# Patient Record
Sex: Female | Born: 1955 | Race: White | Hispanic: No | Marital: Married | Smoking: Never smoker
Health system: Southern US, Community
[De-identification: ages and names within clinical notes are randomized; demographics above are authoritative.]

## PROBLEM LIST (undated history)

## (undated) DIAGNOSIS — E7800 Pure hypercholesterolemia, unspecified: Secondary | ICD-10-CM

## (undated) DIAGNOSIS — I10 Essential (primary) hypertension: Secondary | ICD-10-CM

## (undated) DIAGNOSIS — Z889 Allergy status to unspecified drugs, medicaments and biological substances status: Secondary | ICD-10-CM

## (undated) DIAGNOSIS — G20 Parkinson's disease: Secondary | ICD-10-CM

## (undated) DIAGNOSIS — N809 Endometriosis, unspecified: Secondary | ICD-10-CM

## (undated) DIAGNOSIS — G20A1 Parkinson's disease without dyskinesia, without mention of fluctuations: Secondary | ICD-10-CM

## (undated) DIAGNOSIS — M069 Rheumatoid arthritis, unspecified: Secondary | ICD-10-CM

## (undated) HISTORY — DX: Essential (primary) hypertension: I10

## (undated) HISTORY — DX: Allergy status to unspecified drugs, medicaments and biological substances: Z88.9

## (undated) HISTORY — DX: Parkinson's disease: G20

## (undated) HISTORY — DX: Rheumatoid arthritis, unspecified: M06.9

## (undated) HISTORY — DX: Pure hypercholesterolemia, unspecified: E78.00

## (undated) HISTORY — DX: Parkinson's disease without dyskinesia, without mention of fluctuations: G20.A1

## (undated) HISTORY — DX: Endometriosis, unspecified: N80.9

## (undated) HISTORY — PX: OTHER SURGICAL HISTORY: SHX169

---
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
## 1979-02-15 HISTORY — PX: LAPAROSCOPY: SHX197

## 1989-02-14 HISTORY — PX: FINGER SURGERY: SHX640

## 1991-02-15 HISTORY — PX: OTHER SURGICAL HISTORY: SHX169

## 2002-02-05 ENCOUNTER — Ambulatory Visit (HOSPITAL_BASED_OUTPATIENT_CLINIC_OR_DEPARTMENT_OTHER): Admission: RE | Admit: 2002-02-05 | Discharge: 2002-02-06 | Payer: Self-pay | Admitting: Orthopaedic Surgery

## 2002-02-14 HISTORY — PX: ARTHROSCOPIC REPAIR ACL: SUR80

## 2003-12-30 ENCOUNTER — Encounter: Admission: RE | Admit: 2003-12-30 | Discharge: 2003-12-30 | Payer: Self-pay | Admitting: Family Medicine

## 2004-02-11 ENCOUNTER — Ambulatory Visit (HOSPITAL_COMMUNITY): Admission: RE | Admit: 2004-02-11 | Discharge: 2004-02-11 | Payer: Self-pay | Admitting: *Deleted

## 2006-06-23 ENCOUNTER — Encounter: Admission: RE | Admit: 2006-06-23 | Discharge: 2006-06-23 | Payer: Self-pay | Admitting: Obstetrics and Gynecology

## 2006-08-22 ENCOUNTER — Encounter: Admission: RE | Admit: 2006-08-22 | Discharge: 2006-08-22 | Payer: Self-pay | Admitting: Obstetrics and Gynecology

## 2007-08-15 ENCOUNTER — Ambulatory Visit (HOSPITAL_BASED_OUTPATIENT_CLINIC_OR_DEPARTMENT_OTHER): Admission: RE | Admit: 2007-08-15 | Discharge: 2007-08-15 | Payer: Self-pay | Admitting: Obstetrics and Gynecology

## 2010-03-07 ENCOUNTER — Encounter: Payer: Self-pay | Admitting: Obstetrics and Gynecology

## 2010-07-02 NOTE — Op Note (Signed)
NAMEJOCILYNN, GRADE                            ACCOUNT NO.:  [id number]   MEDICAL RECORD NO.:  [id number]                   PATIENT TYPE:  AMB   LOCATION:  DSC                                  FACILITY:  [facility]   PHYSICIAN:  Lubertha Basque. Jerl Santos, M.D.             DATE OF BIRTH:  January 13, 1956   DATE OF PROCEDURE:  02/05/2002  DATE OF DISCHARGE:                                 OPERATIVE REPORT   PREOPERATIVE DIAGNOSIS:  1. Left knee anterior cruciate ligament tear.  2. Left knee torn medial meniscus.   POSTOPERATIVE DIAGNOSIS:  1. Left knee anterior cruciate ligament tear.  2. Left knee torn medial meniscus.  3. Left knee chondromalacia of the patella.   PROCEDURE:  1. Left knee anterior cruciate ligament reconstruction.  2. Left knee partial medial meniscectomy.  3. Left knee chondroplasty of the patella.   ANESTHESIA:  General and femoral nerve block.   SURGEON:  Lubertha Basque. Jerl Santos, M.D.   INDICATIONS FOR PROCEDURE:  The patient is a 55 year old woman who sustained  a work related injury several weeks ago.  She has been left with a locked  knee which has not come to extension.  She has undergone MRI scan which  showed a displaced medial meniscus tear along with an ACL tear which was  complete.  She was offered operative intervention first just to unlock her  knee and then to reconstruct the ACL at the same setting.  The procedures  were discussed with the patient and informed operative consent was obtained  after discussion of the possible complications of reaction to anesthesia and  infection.  She also understood about the extensive postoperative therapy  required after this type of operation.   DESCRIPTION OF PROCEDURE:  The patient was taken to the operating room where  general anesthetic was applied without difficulty.  She was positioned  supine and prepped and draped in normal sterile fashion.  After the  administration of antibiotics, an arthroscopy of the left knee was  performed  through two inferior portals.  The suprapatellar pouch was benign while the  patellofemoral joint exhibited some grade 3 chondromalacia on the under  surface of the patella and this was addressed with a chondroplasty.  The  medial compartment showed a displaced bucket handle tear of the medial  meniscus in the avascular portion.  This was stuck in an anterior position.  This was removed taking about 20% of the medial meniscus in the process.  There were no degenerative changes in the medial compartment.  The lateral  compartment showed no evidence of meniscal or articular cartilage injury.  The ACL was completely torn and was removed from the notch.  The PCL was  intact.  A thorough notchplasty was done with an arthroscopic bur.  An  allograft was defrosted on the back table completely and was then contoured  to fit through 9 and 10 mm tunnels.  Drill holes were placed in each of the  bone blocks with a PDS suture placed in one and a wire placed in the other.  A guide was then placed in the knee on the tibia just anterior to the PCL.  A stab wound was made on the anterior aspect of the knee and a guide-wire  was placed through this using the guide into the knee.  This was over-reamed  to a diameter of 10 mm with an arthroscopic reamer.  A second guide was  placed in the over the top position just anterior to this area.  A guide-  wire was placed through the tibial tunnel utilizing the guide and entering  the femur in the appropriate position.  This was utilized to over ream the  distal femur to a depth of 3 cm and diameter of 9 mm leaving a 1-2 mm  posterior wall which was well visualized.  Some bony debris was removed from  the knee with the shaver.  The aforementioned graft was then pulled into  position.  Care was taken to keep the tendinous surface of the graft facing  posteriorly.  This graft was secured in the femoral tunnel with an 8 by 25  metal screw placed over an  anterior guide-wire in the tunnel.  The knee was  then ranged fully and the graft was felt to be very isometric.  There was no  impingement at full extension.  A second guide-wire was placed through the  tibial tunnel and was utilized to place an identical screw to secure the  tibial bone plug in the tibial tunnel.  The knee again ranged fully.  The  knee was irrigated followed by placement of Marcaine with epinephrine.  Nylon was used to loosely reapproximate the anterior stab wound.  Adaptic  was applied to the wound followed by dry gauze and a loose Ace wrap.  Estimated blood loss and fluids can be obtained from anesthesia records.  No  tourniquet was placed.   DISPOSITION:  The patient was given a femoral nerve block at the end of the  case.  She was then taken to the recovery room in stable condition with  plans to stay overnight for pain control.                                               Lubertha Basque Jerl Santos, M.D.    PGD/MEDQ  D:  02/05/2002  T:  02/05/2002  Job:  [id number]

## 2010-07-09 ENCOUNTER — Other Ambulatory Visit (HOSPITAL_BASED_OUTPATIENT_CLINIC_OR_DEPARTMENT_OTHER): Payer: Self-pay | Admitting: Internal Medicine

## 2010-07-09 DIAGNOSIS — Z1231 Encounter for screening mammogram for malignant neoplasm of breast: Secondary | ICD-10-CM

## 2010-07-13 ENCOUNTER — Ambulatory Visit (HOSPITAL_BASED_OUTPATIENT_CLINIC_OR_DEPARTMENT_OTHER)
Admission: RE | Admit: 2010-07-13 | Discharge: 2010-07-13 | Disposition: A | Discharge status: Home / Self Care | Payer: BC Managed Care – PPO | Source: Ambulatory Visit | Attending: Internal Medicine | Admitting: Internal Medicine

## 2010-07-13 DIAGNOSIS — Z1231 Encounter for screening mammogram for malignant neoplasm of breast: Secondary | ICD-10-CM | POA: Insufficient documentation

## 2011-08-01 ENCOUNTER — Other Ambulatory Visit: Payer: Self-pay | Admitting: Obstetrics and Gynecology

## 2011-08-01 DIAGNOSIS — Z1231 Encounter for screening mammogram for malignant neoplasm of breast: Secondary | ICD-10-CM

## 2011-08-08 ENCOUNTER — Ambulatory Visit (HOSPITAL_BASED_OUTPATIENT_CLINIC_OR_DEPARTMENT_OTHER)
Admission: RE | Admit: 2011-08-08 | Discharge: 2011-08-08 | Disposition: A | Discharge status: Home / Self Care | Payer: BC Managed Care – PPO | Source: Ambulatory Visit | Attending: Obstetrics and Gynecology | Admitting: Obstetrics and Gynecology

## 2011-08-08 DIAGNOSIS — Z1231 Encounter for screening mammogram for malignant neoplasm of breast: Secondary | ICD-10-CM | POA: Insufficient documentation

## 2011-08-23 ENCOUNTER — Other Ambulatory Visit: Payer: Self-pay | Admitting: Obstetrics and Gynecology

## 2011-08-23 ENCOUNTER — Other Ambulatory Visit (HOSPITAL_COMMUNITY)
Admission: RE | Admit: 2011-08-23 | Discharge: 2011-08-23 | Disposition: A | Discharge status: Home / Self Care | Payer: BC Managed Care – PPO | Source: Ambulatory Visit | Attending: Obstetrics and Gynecology | Admitting: Obstetrics and Gynecology

## 2011-08-23 DIAGNOSIS — Z124 Encounter for screening for malignant neoplasm of cervix: Secondary | ICD-10-CM | POA: Insufficient documentation

## 2012-10-05 ENCOUNTER — Other Ambulatory Visit: Payer: Self-pay | Admitting: Obstetrics and Gynecology

## 2012-10-05 DIAGNOSIS — Z1231 Encounter for screening mammogram for malignant neoplasm of breast: Secondary | ICD-10-CM

## 2012-10-08 ENCOUNTER — Ambulatory Visit (HOSPITAL_BASED_OUTPATIENT_CLINIC_OR_DEPARTMENT_OTHER)
Admission: RE | Admit: 2012-10-08 | Discharge: 2012-10-08 | Disposition: A | Discharge status: Home / Self Care | Payer: BC Managed Care – PPO | Source: Ambulatory Visit | Attending: Obstetrics and Gynecology | Admitting: Obstetrics and Gynecology

## 2012-10-08 ENCOUNTER — Inpatient Hospital Stay (HOSPITAL_BASED_OUTPATIENT_CLINIC_OR_DEPARTMENT_OTHER): Admission: RE | Admit: 2012-10-08 | Payer: BC Managed Care – PPO | Source: Ambulatory Visit

## 2012-10-08 DIAGNOSIS — Z1231 Encounter for screening mammogram for malignant neoplasm of breast: Secondary | ICD-10-CM | POA: Insufficient documentation

## 2013-06-14 ENCOUNTER — Encounter: Payer: Self-pay | Admitting: Neurology

## 2013-06-17 ENCOUNTER — Encounter (INDEPENDENT_AMBULATORY_CARE_PROVIDER_SITE_OTHER): Payer: Self-pay

## 2013-06-17 ENCOUNTER — Encounter: Payer: Self-pay | Admitting: Neurology

## 2013-06-17 ENCOUNTER — Ambulatory Visit (INDEPENDENT_AMBULATORY_CARE_PROVIDER_SITE_OTHER): Payer: BC Managed Care – PPO | Admitting: Neurology

## 2013-06-17 VITALS — BP 135/58 | HR 67 | Ht 68.0 in | Wt 228.0 lb

## 2013-06-17 DIAGNOSIS — R259 Unspecified abnormal involuntary movements: Secondary | ICD-10-CM

## 2013-06-17 DIAGNOSIS — R251 Tremor, unspecified: Secondary | ICD-10-CM

## 2013-06-17 DIAGNOSIS — R269 Unspecified abnormalities of gait and mobility: Secondary | ICD-10-CM

## 2013-06-17 DIAGNOSIS — R32 Unspecified urinary incontinence: Secondary | ICD-10-CM

## 2013-06-17 DIAGNOSIS — R2681 Unsteadiness on feet: Secondary | ICD-10-CM

## 2013-06-17 NOTE — Progress Notes (Signed)
[facility]    Provider:  Dr Hosie Poisson Referring Provider: Jackie Plum, MD Primary Care Physician:  Jackie Plum, MD  CC:  tremors  HPI:  Laura Lawrence is a 58 y.o. female here as a referral from Dr. Julio Sicks for tremors  Feels symptoms started around 2 months ago and has gotten progressively worse since then. Describes both a rest and action/postural tremor. Handwriting has gotten messier, no change in size. Notes some generalized slowing down of her movements. She feels off balance, having falls recently. Having difficulty with fine motor tasks. Notes some muscle cramps, aching in her legs. Tremor worse with stress/anxiety. No change with caffeine. Does not drink EtOH. No recent changes to her medications. Around Thanksgiving has noted loss of bladder control, states she will go without even knowing she was going.   No family history of tremor. Has history of HTN, no known TIA or strokes in the past.   Review of Systems: Out of a complete 14 system review, the patient complains of only the following symptoms, and all other reviewed systems are negative. + tremor, gait instability, fatigue  History   Social History  . Marital Status: Married    Spouse Name: Casimiro Needle    Number of Children: N/A  . Years of Education: N/A   Occupational History  . Not on file.   Social History Main Topics  . Smoking status: Never Smoker   . Smokeless tobacco: Never Used  . Alcohol Use: No  . Drug Use: No  . Sexual Activity: Not on file   Other Topics Concern  . Not on file   Social History Narrative   Patient is married Casimiro Needle).    Family History  Problem Relation Age of Onset  . Heart disease Father   . Osteoporosis Father   . Hypertension Father   . Leukemia Father   . Diabetes Mother   . Arthritis Mother   . Asthma Mother   . Hypertension Mother   . Bursitis Mother   . Gout      Past Medical History  Diagnosis Date  . Rheumatoid arthritis    . Hypercholesterolemia   . Hypertension   . Multiple allergies     No past surgical history on file.  Current Outpatient Prescriptions  Medication Sig Dispense Refill  . Adalimumab (HUMIRA) 40 MG/0.8ML PSKT Inject into the skin. Every two weeks.      . ACETAMINOPHEN-BUTALBITAL 50-650 MG TABS Take 1 tablet by mouth. Every 8-12 hours as needed.      . fluticasone (FLONASE) 50 MCG/ACT nasal spray Place 1 spray into both nostrils daily.      Marland Kitchen lisinopril (PRINIVIL,ZESTRIL) 30 MG tablet Take 30 mg by mouth daily.      . methotrexate (50 MG/ML) 1 G injection Inject into the vein once. 0.6 Injection- Once a week.      . NORTRIPTYLINE HCL PO Take 1 capsule by mouth at bedtime.      . pravastatin (PRAVACHOL) 20 MG tablet Take 20 mg by mouth at bedtime.       No current facility-administered medications for this visit.    Allergies as of 06/17/2013 - Review Complete 06/17/2013  Allergen Reaction Noted  . Codeine  06/14/2013  . Guaifenesin & derivatives  06/14/2013  . Penicillins  06/14/2013  . Sulfa antibiotics  06/14/2013    Vitals: BP 135/58  Pulse 67  Ht 5\' 8"  (1.727 m)  Wt 228 lb (103.42 kg)  BMI 34.68 kg/m2 Last Weight:  Wt Readings from Last 1 Encounters:  06/17/13 228 lb (103.42 kg)   Last Height:   Ht Readings from Last 1 Encounters:  06/17/13 5\' 8"  (1.727 m)     Physical exam: Exam: Gen: NAD, conversant Eyes: anicteric sclerae, moist conjunctivae HENT: Atraumatic, oropharynx clear Neck: Trachea midline; supple,  Lungs: CTA, no wheezing, rales, rhonic                          CV: RRR, no MRG Abdomen: Soft, non-tender;  Extremities: No peripheral edema  Skin: Normal temperature, no rash,  Psych: Appropriate affect, pleasant  Neuro: MS: AA&Ox3, appropriately interactive, normal affect   Attention: WORLD backwards  Speech: fluent w/o paraphasic error  Memory: good recent and remote recall  CN: PERRL, EOMI no nystagmus, no ptosis, sensation intact to LT  V1-V3 bilat, face symmetric, no weakness, hearing grossly intact, palate elevates symmetrically, shoulder shrug 5/5 bilat,  tongue protrudes midline, no fasiculations noted.  Motor: normal bulk and tone Strength: 5/5  In all extremities  Coord: moderate bradykineisa with finger taps, hand opening, foot tapping bilateral. Minimal rest tremor L hand, mild postural and intention tremor bilaterally.   Reflexes: diminished, bilateral downgoing plantar response  Sens: LT intact in all extremities, intact propioception, vibration bilateral LE. No saddle anesthesia  Gait: slightly wide based, decreased arm swing bilaterally, unable to tandem, takes few steps back with pull test but corrects self, falls with Romberg with eyes closed   Assessment:  After physical and neurologic examination, review of laboratory studies, imaging, neurophysiology testing and pre-existing records, assessment will be reviewed on the problem list.  Plan:  Treatment plan and additional workup will be reviewed under Problem List.  1)Tremor 2)Gait instability 3)Bladder incontinence  57y/o woman presenting for initial evaluation of tremor and gait instability. History is also pertinent for some recent bladder incontinence and some subjective cognitive decline. Unclear etiology of symptoms. Constellation of symptoms raises question of NPH though overall history not fully consistent with this. She does have multiple parkinsonian features on exam though question if RA playing a role in her bradykinesia. Will check MRI brain and C spine. Follow up once imaging completed.    , DO  [facility] [address] [place], [place]  Phone [phone] Fax [phone]

## 2013-06-17 NOTE — Patient Instructions (Signed)
Overall you are doing fairly well but I do want to suggest a few things today:   Remember to drink plenty of fluid, eat healthy meals and do not skip any meals. Try to eat protein with a every meal and eat a healthy snack such as fruit or nuts in between meals. Try to keep a regular sleep-wake schedule and try to exercise daily, particularly in the form of walking, 20-30 minutes a day, if you can.   As far as diagnostic testing:  1)I would like you to have a MRI of your brain and cervical spine. You will be called to schedule this  I would like to see you back in 1 to 2 months, sooner if we need to. Please call us with any interim questions, concerns, problems, updates or refill requests.   My clinical assistant and will answer any of your questions and relay your messages to me and also relay most of my messages to you.   Our phone number is [phone]. We also have an after hours call service for urgent matters and there is a physician on-call for urgent questions. For any emergencies you know to call 911 or go to the nearest emergency room

## 2013-07-04 ENCOUNTER — Other Ambulatory Visit: Payer: BC Managed Care – PPO

## 2013-07-09 ENCOUNTER — Ambulatory Visit
Admission: RE | Admit: 2013-07-09 | Discharge: 2013-07-09 | Disposition: A | Discharge status: Home / Self Care | Payer: BC Managed Care – PPO | Source: Ambulatory Visit | Attending: Neurology | Admitting: Neurology

## 2013-07-09 DIAGNOSIS — R2681 Unsteadiness on feet: Secondary | ICD-10-CM

## 2013-07-09 DIAGNOSIS — R269 Unspecified abnormalities of gait and mobility: Secondary | ICD-10-CM

## 2013-07-09 DIAGNOSIS — R251 Tremor, unspecified: Secondary | ICD-10-CM

## 2013-07-09 DIAGNOSIS — R32 Unspecified urinary incontinence: Secondary | ICD-10-CM

## 2013-07-09 DIAGNOSIS — R259 Unspecified abnormal involuntary movements: Secondary | ICD-10-CM

## 2013-07-11 ENCOUNTER — Telehealth: Payer: Self-pay | Admitting: Neurology

## 2013-07-11 NOTE — Telephone Encounter (Signed)
Patient returning Tasha's call.

## 2013-07-11 NOTE — Progress Notes (Signed)
Quick Note:  Left voice message to return the call ______

## 2013-07-11 NOTE — Progress Notes (Signed)
Quick Note:  Spoke with patient she is scheduled for Tuesday 07/16/13 at 2:30 pm. ______

## 2013-07-12 NOTE — Telephone Encounter (Signed)
Spoke with patient on 07/11/13, patient was scheduled for 07/16/13 at 2:30 pm

## 2013-07-16 ENCOUNTER — Encounter: Payer: Self-pay | Admitting: Neurology

## 2013-07-16 ENCOUNTER — Ambulatory Visit (INDEPENDENT_AMBULATORY_CARE_PROVIDER_SITE_OTHER): Payer: BC Managed Care – PPO | Admitting: Neurology

## 2013-07-16 VITALS — BP 134/62 | HR 68 | Ht 68.0 in | Wt 225.0 lb

## 2013-07-16 DIAGNOSIS — R2681 Unsteadiness on feet: Secondary | ICD-10-CM

## 2013-07-16 DIAGNOSIS — R32 Unspecified urinary incontinence: Secondary | ICD-10-CM

## 2013-07-16 DIAGNOSIS — R269 Unspecified abnormalities of gait and mobility: Secondary | ICD-10-CM

## 2013-07-16 NOTE — Patient Instructions (Signed)
Overall you are doing fairly well but I do want to suggest a few things today:   As far as diagnostic testing:  1)Please have some blood work completed today 2)I would like you to have a MRI of the Lumbar spine, you will be called to schedule this  I will contact you once the above workup is completed. Please call us with any interim questions, concerns, problems, updates or refill requests.   My clinical assistant and will answer any of your questions and relay your messages to me and also relay most of my messages to you.   Our phone number is [phone]. We also have an after hours call service for urgent matters and there is a physician on-call for urgent questions. For any emergencies you know to call 911 or go to the nearest emergency room

## 2013-07-16 NOTE — Progress Notes (Signed)
[facility]    Provider:  Dr Hosie Poisson Referring Provider: Jackie Plum, MD Primary Care Physician:  Jackie Plum, MD  CC:  tremors  HPI:  Laura Lawrence is a 58 y.o. female here as a follow up from Dr. Julio Sicks for tremors. At last visit she had a MRI of the brain and C spine both of which were overall unremarkable. Returns today with continued difficulty with her walking, feels very off balance, unsteady on her feet. Notes continued rest and postural/action tremor. No other acute issues at this time.   Initial visit 06/2013: Feels symptoms started around 2 months ago and has gotten progressively worse since then. Describes both a rest and action/postural tremor. Handwriting has gotten messier, no change in size. Notes some generalized slowing down of her movements. She feels off balance, having falls recently. Having difficulty with fine motor tasks. Notes some muscle cramps, aching in her legs. Tremor worse with stress/anxiety. No change with caffeine. Does not drink EtOH. No recent changes to her medications. Around Thanksgiving has noted loss of bladder control, states she will go without even knowing she was going.   No family history of tremor. Has history of HTN, no known TIA or strokes in the past.   Review of Systems: Out of a complete 14 system review, the patient complains of only the following symptoms, and all other reviewed systems are negative. + tremor, gait instability, fatigue  History   Social History  . Marital Status: Married    Spouse Name: Casimiro Needle    Number of Children: N/A  . Years of Education: N/A   Occupational History  . Not on file.   Social History Main Topics  . Smoking status: Never Smoker   . Smokeless tobacco: Never Used  . Alcohol Use: No  . Drug Use: No  . Sexual Activity: Not on file   Other Topics Concern  . Not on file   Social History Narrative   Patient is married Casimiro Needle), 2 children   Associates  degree   Right handed   3 cups daily    Family History  Problem Relation Age of Onset  . Heart disease Father   . Osteoporosis Father   . Hypertension Father   . Leukemia Father   . Diabetes Mother   . Arthritis Mother   . Asthma Mother   . Hypertension Mother   . Bursitis Mother   . Gout    . Brain cancer Paternal Aunt     Past Medical History  Diagnosis Date  . Rheumatoid arthritis   . Hypercholesterolemia   . Hypertension   . Multiple allergies     No past surgical history on file.  Current Outpatient Prescriptions  Medication Sig Dispense Refill  . ACETAMINOPHEN-BUTALBITAL 50-650 MG TABS Take 1 tablet by mouth. Every 8-12 hours as needed.      . Adalimumab (HUMIRA) 40 MG/0.8ML PSKT Inject into the skin. Every two weeks.      . Cholecalciferol (VITAMIN D-3) 1000 UNITS CAPS Take 1,000 Units by mouth.      . clindamycin (CLEOCIN) 300 MG capsule       . diphenhydrAMINE (BENADRYL) 25 MG tablet Take 25 mg by mouth every 6 (six) hours as needed.      Marland Kitchen DOCUSATE SODIUM PO Take 100 mg by mouth.      . fluticasone (FLONASE) 50 MCG/ACT nasal spray Place 1 spray into both nostrils daily.      . folic acid (FOLVITE) 800 MCG tablet  Take 400 mcg by mouth daily.      Marland Kitchen lisinopril (PRINIVIL,ZESTRIL) 30 MG tablet Take 30 mg by mouth daily.      . methotrexate (50 MG/ML) 1 G injection Inject into the vein once. 0.6 Injection- Once a week.      . Methylcellulose, Laxative, (CITRUCEL) 500 MG TABS Take 500 mg by mouth.      . Multiple Vitamins-Minerals (MULTIVITAMIN WITH MINERALS) tablet Take 1 tablet by mouth daily.      Marland Kitchen NAPROXEN PO Take 200 mg by mouth.      . NORTRIPTYLINE HCL PO Take 2 capsules by mouth at bedtime.       Marland Kitchen omega-3 acid ethyl esters (LOVAZA) 1 G capsule Take by mouth 2 (two) times daily.      . pravastatin (PRAVACHOL) 20 MG tablet Take 20 mg by mouth at bedtime.      . sertraline (ZOLOFT) 50 MG tablet        No current facility-administered medications for this  visit.    Allergies as of 07/16/2013 - Review Complete 06/17/2013  Allergen Reaction Noted  . Codeine  06/14/2013  . Guaifenesin & derivatives  06/14/2013  . Penicillins  06/14/2013  . Sulfa antibiotics  06/14/2013    Vitals: BP 134/62  Pulse 68  Ht 5\' 8"  (1.727 m)  Wt 225 lb (102.059 kg)  BMI 34.22 kg/m2 Last Weight:  Wt Readings from Last 1 Encounters:  07/16/13 225 lb (102.059 kg)   Last Height:   Ht Readings from Last 1 Encounters:  07/16/13 5\' 8"  (1.727 m)     Physical exam: Exam: Gen: NAD, conversant Eyes: anicteric sclerae, moist conjunctivae HENT: Atraumatic, oropharynx clear Neck: Trachea midline; supple,  Lungs: CTA, no wheezing, rales, rhonic                          CV: RRR, no MRG Abdomen: Soft, non-tender;  Extremities: No peripheral edema  Skin: Normal temperature, no rash,  Psych: Appropriate affect, pleasant  Neuro: MS: AA&Ox3, appropriately interactive, normal affect   Attention: WORLD backwards  Speech: fluent w/o paraphasic error  Memory: good recent and remote recall  CN: PERRL, EOMI no nystagmus, no ptosis, sensation intact to LT V1-V3 bilat, face symmetric, no weakness, hearing grossly intact, palate elevates symmetrically, shoulder shrug 5/5 bilat,  tongue protrudes midline, no fasiculations noted.  Motor: normal bulk and tone Strength: 5/5  In all extremities  Coord: moderate bradykineisa with finger taps, hand opening, foot tapping bilateral. Minimal rest tremor L hand, mild postural and intention tremor bilaterally.   Reflexes: diminished, bilateral downgoing plantar response  Sens: LT intact in all extremities, intact propioception, vibration bilateral LE. No saddle anesthesia  Gait: slightly wide based, decreased arm swing bilaterally, unable to tandem, takes few steps back with pull test but corrects self, falls with Romberg with eyes closed   Assessment:  After physical and neurologic examination, review of laboratory  studies, imaging, neurophysiology testing and pre-existing records, assessment will be reviewed on the problem list.  Plan:  Treatment plan and additional workup will be reviewed under Problem List.  1)Tremor 2)Gait instability 3)Bladder incontinence   57y/o woman presenting for initial evaluation of tremor and gait instability. History is also pertinent for some recent bladder incontinence and some subjective cognitive decline. Unclear etiology of symptoms. Constellation of symptoms raises question of NPH though overall history and MRI not fully consistent with this. 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 Will check lab workup and MRI L spine. If workup unremarkable would consider trial of L-dopa.   Elspeth Cho, DO  [facility] [address] [place], [place] [address]  Phone [phone] Fax [phone]

## 2013-07-17 NOTE — Progress Notes (Signed)
Quick Note:  Called patient and left a voice message of normal labs, any questions or concerns to call the office back. ______

## 2013-07-18 LAB — TSH: TSH: 2.32 u[IU]/mL (ref 0.450–4.500)

## 2013-07-18 LAB — VITAMIN B12

## 2013-07-18 LAB — VITAMIN B1, WHOLE BLOOD: Thiamine: 98.9 nmol/L (ref 66.5–200.0)

## 2013-07-18 LAB — METHYLMALONIC ACID, SERUM: METHYLMALONIC ACID: 191 nmol/L (ref 0–378)

## 2013-07-19 ENCOUNTER — Telehealth: Payer: Self-pay | Admitting: *Deleted

## 2013-07-19 NOTE — Telephone Encounter (Signed)
Patient returned my call, I informed her that her appointment on 07/23/13 was not needed since she was seen on 07/16/13, appointment has been scheduled, patient had no further questions or concerns.

## 2013-07-19 NOTE — Telephone Encounter (Signed)
Patient is on the schedule for 07/23/13, and patient was just seen on 07/16/13, she does not need to come back in for an appointment, left message to return the call, at this time the appointment has not been cancelled.

## 2013-07-23 ENCOUNTER — Ambulatory Visit: Payer: BC Managed Care – PPO | Admitting: Neurology

## 2013-07-23 DIAGNOSIS — R269 Unspecified abnormalities of gait and mobility: Secondary | ICD-10-CM

## 2013-07-30 ENCOUNTER — Telehealth: Payer: Self-pay | Admitting: *Deleted

## 2013-07-30 ENCOUNTER — Other Ambulatory Visit: Payer: Self-pay | Admitting: Diagnostic Neuroimaging

## 2013-07-30 DIAGNOSIS — R2681 Unsteadiness on feet: Secondary | ICD-10-CM

## 2013-07-30 DIAGNOSIS — R32 Unspecified urinary incontinence: Secondary | ICD-10-CM

## 2013-07-30 NOTE — Telephone Encounter (Signed)
Called patient to see if she had her MRI done, left message to return the call.

## 2013-07-30 NOTE — Telephone Encounter (Signed)
Patient returning my call, MRI was done at [facility] in [place], still waiting on call back from [place], for report.

## 2013-07-30 NOTE — Telephone Encounter (Signed)
MRI report is being read by Dr. Marjory Lies.

## 2013-07-31 ENCOUNTER — Encounter: Payer: Self-pay | Admitting: Neurology

## 2013-07-31 ENCOUNTER — Ambulatory Visit (INDEPENDENT_AMBULATORY_CARE_PROVIDER_SITE_OTHER): Payer: BC Managed Care – PPO | Admitting: Neurology

## 2013-07-31 VITALS — Ht 68.0 in | Wt 218.0 lb

## 2013-07-31 DIAGNOSIS — R259 Unspecified abnormal involuntary movements: Secondary | ICD-10-CM

## 2013-07-31 DIAGNOSIS — R251 Tremor, unspecified: Secondary | ICD-10-CM

## 2013-07-31 DIAGNOSIS — R269 Unspecified abnormalities of gait and mobility: Secondary | ICD-10-CM

## 2013-07-31 DIAGNOSIS — R2681 Unsteadiness on feet: Secondary | ICD-10-CM

## 2013-07-31 MED ORDER — CARBIDOPA-LEVODOPA 25-100 MG PO TABS: 1.0000 | ORAL_TABLET | Freq: Three times a day (TID) | ORAL | Status: DC

## 2013-07-31 NOTE — Progress Notes (Signed)
Quick Note:  Patient had an appointment on 07/31/13 at 8 am. ______

## 2013-07-31 NOTE — Patient Instructions (Signed)
Overall you are doing fairly well but I do want to suggest a few things today:   Remember to drink plenty of fluid, eat healthy meals and do not skip any meals. Try to eat protein with a every meal and eat a healthy snack such as fruit or nuts in between meals. Try to keep a regular sleep-wake schedule and try to exercise daily, particularly in the form of walking, 20-30 minutes a day, if you can.   As far as your medications are concerned, I would like to suggest the following: 1)I would like you to start Sinemet using the following schedule: -Please start 1/2 tablet three times a day for 7 days and then increase to one whole tablet three times a day. Please take at 8am, noon and 4pm (or any 4 hour interval)  Please call me in 4 weeks to update me on your status. Please call us with any interim questions, concerns, problems, updates or refill requests.   My clinical assistant and will answer any of your questions and relay your messages to me and also relay most of my messages to you.   Our phone number is [phone]. We also have an after hours call service for urgent matters and there is a physician on-call for urgent questions. For any emergencies you know to call 911 or go to the nearest emergency room

## 2013-07-31 NOTE — Progress Notes (Signed)
[facility]    Provider:  Dr Hosie Poisson Referring Provider: Jackie Plum, MD Primary Care Physician:  Jackie Plum, MD  CC:  tremors  HPI:  Laura Lawrence is a 58 y.o. female here as a follow up from Dr. Julio Sicks for tremors. At last visit she had a MRI of the lumbar spine which showed some degenerative changes but no signs of compression. Continues to have difficulty with gait, notes episodes where she will start walking, "faster and faster" and not be able to stop until she falls over. Continues to have mild rest and postural tremor. Notes worsening handwriting, described as messy and smaller.    Initial visit 06/2013: Feels symptoms started around 2 months ago and has gotten progressively worse since then. Describes both a rest and action/postural tremor. Handwriting has gotten messier, no change in size. Notes some generalized slowing down of her movements. She feels off balance, having falls recently. Having difficulty with fine motor tasks. Notes some muscle cramps, aching in her legs. Tremor worse with stress/anxiety. No change with caffeine. Does not drink EtOH. No recent changes to her medications. Around Thanksgiving has noted loss of bladder control, states she will go without even knowing she was going.   No family history of tremor. Has history of HTN, no known TIA or strokes in the past.   Review of Systems: Out of a complete 14 system review, the patient complains of only the following symptoms, and all other reviewed systems are negative. + tremor, gait instability, fatigue  History   Social History  . Marital Status: Married    Spouse Name: Casimiro Needle    Number of Children: 1  . Years of Education: college   Occupational History  . self employed    Social History Main Topics  . Smoking status: Never Smoker   . Smokeless tobacco: Never Used  . Alcohol Use: No  . Drug Use: No  . Sexual Activity: Not on file   Other Topics Concern  .  Not on file   Social History Narrative   Patient is married Casimiro Needle), 2 children   Associates degree   Right handed   3 cups daily    Family History  Problem Relation Age of Onset  . Heart disease Father   . Osteoporosis Father   . Hypertension Father   . Leukemia Father   . Diabetes Mother   . Arthritis Mother   . Asthma Mother   . Hypertension Mother   . Bursitis Mother   . Gout    . Brain cancer Paternal Aunt     Past Medical History  Diagnosis Date  . Rheumatoid arthritis   . Hypercholesterolemia   . Hypertension   . Multiple allergies     No past surgical history on file.  Current Outpatient Prescriptions  Medication Sig Dispense Refill  . ACETAMINOPHEN-BUTALBITAL 50-650 MG TABS Take 1 tablet by mouth. Every 8-12 hours as needed.      . Adalimumab (HUMIRA) 40 MG/0.8ML PSKT Inject into the skin. Every two weeks.      . Cholecalciferol (VITAMIN D-3) 1000 UNITS CAPS Take 1,000 Units by mouth.      . clindamycin (CLEOCIN) 300 MG capsule       . diphenhydrAMINE (BENADRYL) 25 MG tablet Take 25 mg by mouth every 6 (six) hours as needed.      Marland Kitchen DOCUSATE SODIUM PO Take 100 mg by mouth.      . fluticasone (FLONASE) 50 MCG/ACT nasal spray Place 1 spray  into both nostrils daily.      . folic acid (FOLVITE) 800 MCG tablet Take 400 mcg by mouth daily.      Marland Kitchen lisinopril (PRINIVIL,ZESTRIL) 30 MG tablet Take 30 mg by mouth daily.      . methotrexate (50 MG/ML) 1 G injection Inject into the vein once. 0.6 Injection- Once a week.      . Methylcellulose, Laxative, (CITRUCEL) 500 MG TABS Take 500 mg by mouth.      . Multiple Vitamins-Minerals (MULTIVITAMIN WITH MINERALS) tablet Take 1 tablet by mouth daily.      Marland Kitchen NAPROXEN PO Take 200 mg by mouth.      . NORTRIPTYLINE HCL PO Take 2 capsules by mouth at bedtime.       Marland Kitchen omega-3 acid ethyl esters (LOVAZA) 1 G capsule Take by mouth 2 (two) times daily.      . pravastatin (PRAVACHOL) 20 MG tablet Take 20 mg by mouth at bedtime.       . sertraline (ZOLOFT) 50 MG tablet        No current facility-administered medications for this visit.    Allergies as of 07/31/2013 - Review Complete 07/31/2013  Allergen Reaction Noted  . Codeine  06/14/2013  . Guaifenesin & derivatives  06/14/2013  . Penicillins  06/14/2013  . Sulfa antibiotics  06/14/2013    Vitals: Ht 5\' 8"  (1.727 m)  Wt 218 lb (98.884 kg)  BMI 33.15 kg/m2 Last Weight:  Wt Readings from Last 1 Encounters:  07/31/13 218 lb (98.884 kg)   Last Height:   Ht Readings from Last 1 Encounters:  07/31/13 5\' 8"  (1.727 m)     Physical exam: Exam: Gen: NAD, conversant Eyes: anicteric sclerae, moist conjunctivae HENT: Atraumatic, oropharynx clear Neck: Trachea midline; supple,  Lungs: CTA, no wheezing, rales, rhonic                          CV: RRR, no MRG Abdomen: Soft, non-tender;  Extremities: No peripheral edema  Skin: Normal temperature, no rash,  Psych: Appropriate affect, pleasant  Neuro: MS: AA&Ox3, appropriately interactive, normal affect   Attention: WORLD backwards  Speech: fluent w/o paraphasic error  Memory: good recent and remote recall  CN: PERRL, EOMI no nystagmus, no ptosis, sensation intact to LT V1-V3 bilat, face symmetric, no weakness, hearing grossly intact, palate elevates symmetrically, shoulder shrug 5/5 bilat,  tongue protrudes midline, no fasiculations noted.  Motor: normal bulk and tone Strength: 5/5  In all extremities  Coord: moderate bradykineisa with finger taps, hand opening, foot tapping bilateral. Minimal rest tremor L hand, mild postural and intention tremor bilaterally.   Reflexes: diminished, bilateral downgoing plantar response  Sens: LT intact in all extremities, intact propioception, vibration bilateral LE. No saddle anesthesia  Gait: slightly wide based, decreased arm swing bilaterally, unable to tandem, takes few steps back with pull test but corrects self, falls with Romberg with eyes  closed   Assessment:  After physical and neurologic examination, review of laboratory studies, imaging, neurophysiology testing and pre-existing records, assessment will be reviewed on the problem list.  Plan:  Treatment plan and additional workup will be reviewed under Problem List.  1)Tremor 2)Gait instability 3)Bladder incontinence   57y/o woman presenting for initial evaluation of tremor and gait instability. History is also pertinent for some recent bladder incontinence and some subjective cognitive decline. Unclear etiology of symptoms. Constellation of symptoms raises question of NPH though overall history and MRI not fully consistent  with this. Would hold off on diagnostic LP at this time. She does have multiple parkinsonian features on exam though question if RA playing a role in her bradykinesia. MRI imaging has been overall unremarkable. Based on clinical symptoms will do trial treatment with Sinemet 25-100 to see if symptomatic benefit. Patient counseled on potential side effects, instructed to call office in 4 weeks to update me on her status.    Elspeth Cho, DO  [facility] [address] [place], [place] [address]  Phone [phone] Fax [phone]

## 2013-09-03 ENCOUNTER — Telehealth: Payer: Self-pay | Admitting: Neurology

## 2013-09-03 NOTE — Telephone Encounter (Signed)
Patient said that she may need an additional dosage by 9 pm,seems like everything falls apart in that time period. She is has had improvements but they medicine seems to wear off at that point. Patient started out happy but by end of conversation she is sobbing.

## 2013-09-03 NOTE — Telephone Encounter (Signed)
Please let her know to take an additional dose at 8pm. Please also schedule a follow up with Darrol Angel in 4 weeks. Thanks.

## 2013-09-03 NOTE — Telephone Encounter (Signed)
Patient called and stated carbidopa-levodopa (SINEMET IR) 25-100 MG per tablet has helped with symptoms.  She could see an approve ment with in 24 hours.  After each dose she can tell after 3 hours its starting to wear off.  Only side effect she has experienced is feeling over heated and nauseous.

## 2013-09-03 NOTE — Telephone Encounter (Signed)
Informed patient, per Dr Minus Breeding note, she verbalized understanding,scheduled/confirmed appt.

## 2013-09-04 ENCOUNTER — Other Ambulatory Visit: Payer: Self-pay

## 2013-09-04 ENCOUNTER — Encounter: Payer: Self-pay | Admitting: Neurology

## 2013-09-04 MED ORDER — CARBIDOPA-LEVODOPA 25-100 MG PO TABS: 1.0000 | ORAL_TABLET | Freq: Four times a day (QID) | ORAL | Status: DC

## 2013-09-04 NOTE — Telephone Encounter (Signed)
Rx updated and sent per phone note from yesterday

## 2013-09-12 ENCOUNTER — Encounter: Payer: Self-pay | Admitting: Neurology

## 2013-09-13 ENCOUNTER — Encounter: Payer: Self-pay | Admitting: Neurology

## 2013-09-19 NOTE — Telephone Encounter (Signed)
Noted  

## 2013-09-27 ENCOUNTER — Ambulatory Visit: Payer: Self-pay | Admitting: Neurology

## 2013-09-30 ENCOUNTER — Other Ambulatory Visit: Payer: Self-pay | Admitting: Neurology

## 2013-10-10 ENCOUNTER — Ambulatory Visit (INDEPENDENT_AMBULATORY_CARE_PROVIDER_SITE_OTHER): Payer: BC Managed Care – PPO | Admitting: Neurology

## 2013-10-10 ENCOUNTER — Encounter: Payer: Self-pay | Admitting: Neurology

## 2013-10-10 VITALS — BP 115/54 | HR 65 | Ht 67.5 in | Wt 212.0 lb

## 2013-10-10 DIAGNOSIS — G20 Parkinson's disease: Secondary | ICD-10-CM

## 2013-10-10 MED ORDER — CARBIDOPA-LEVODOPA 25-100 MG PO TABS: 1.0000 | ORAL_TABLET | Freq: Four times a day (QID) | ORAL | Status: DC

## 2013-10-10 NOTE — Patient Instructions (Signed)
Overall you are doing fairly well but I do want to suggest a few things today:   As far as your medications are concerned, I would like to suggest not making any changes today. A refill of your Sinemet was sent.   A referral was sent for physical therapy. You will be called to schedule this.   Please follow up with Dr. Frances Furbish in 4 months. Please call us with any interim questions, concerns, problems, updates or refill requests.   Please also call us for any test results so we can go over those with you on the phone.  My clinical assistant and will answer any of your questions and relay your messages to me and also relay most of my messages to you.   Our phone number is [phone]. We also have an after hours call service for urgent matters and there is a physician on-call for urgent questions. For any emergencies you know to call 911 or go to the nearest emergency room

## 2013-10-10 NOTE — Progress Notes (Signed)
[facility]    Provider:  Dr Hosie Poisson Referring Provider: Jackie Plum, MD Primary Care Physician:  Jackie Plum, MD  CC:  tremors  HPI:  Laura Lawrence is a 58 y.o. female here as a follow up from Dr. Julio Sicks for tremors. Overall doing well. Had one fall since last visit, notes her feet got tied up and wouldn't move. Notes doing well with the Sinemet. Currently taking Sinemet 25-100 QID at 8am, noon, 4pm and 8pm. Denies any motor fluctuations, no wearing off noted. No dyskinesias. Happy with current control, feels it has made a big difference. Sleeping well, minimal REM behavior disorder.   Notes prior lab test showing elevated LFTs. Following up with rheumatology, they may consider tapering off of the methotrexate.     Initial visit 06/2013: Feels symptoms started around 2 months ago and has gotten progressively worse since then. Describes both a rest and action/postural tremor. Handwriting has gotten messier, no change in size. Notes some generalized slowing down of her movements. She feels off balance, having falls recently. Having difficulty with fine motor tasks. Notes some muscle cramps, aching in her legs. Tremor worse with stress/anxiety. No change with caffeine. Does not drink EtOH. No recent changes to her medications. Around Thanksgiving has noted loss of bladder control, states she will go without even knowing she was going.   No family history of tremor. Has history of HTN, no known TIA or strokes in the past.   Review of Systems: Out of a complete 14 system review, the patient complains of only the following symptoms, and all other reviewed systems are negative. + tremor, gait instability, fatigue  History   Social History  . Marital Status: Married    Spouse Name: Casimiro Needle    Number of Children: 1  . Years of Education: college   Occupational History  . self employed    Social History Main Topics  . Smoking status: Never Smoker   .  Smokeless tobacco: Never Used  . Alcohol Use: No  . Drug Use: No  . Sexual Activity: Not on file   Other Topics Concern  . Not on file   Social History Narrative   Patient is married Casimiro Needle), 2 children   Associates degree   Right handed   3 cups daily    Family History  Problem Relation Age of Onset  . Heart disease Father   . Osteoporosis Father   . Hypertension Father   . Leukemia Father   . Diabetes Mother   . Arthritis Mother   . Asthma Mother   . Hypertension Mother   . Bursitis Mother   . Gout    . Brain cancer Paternal Aunt     Past Medical History  Diagnosis Date  . Rheumatoid arthritis   . Hypercholesterolemia   . Hypertension   . Multiple allergies     History reviewed. No pertinent past surgical history.  Current Outpatient Prescriptions  Medication Sig Dispense Refill  . Adalimumab (HUMIRA) 40 MG/0.8ML PSKT Inject into the skin. Every two weeks.      . carbidopa-levodopa (SINEMET IR) 25-100 MG per tablet TAKE ONE TABLET BY MOUTH FOUR TIMES DAILY   120 tablet  0  . Cholecalciferol (VITAMIN D-3) 1000 UNITS CAPS Take 1,000 Units by mouth.      . clindamycin (CLEOCIN) 300 MG capsule       . diphenhydrAMINE (BENADRYL) 25 MG tablet Take 25 mg by mouth every 6 (six) hours as needed.      Marland Kitchen  DOCUSATE SODIUM PO Take 100 mg by mouth.      . folic acid (FOLVITE) 800 MCG tablet Take 400 mcg by mouth daily.      Marland Kitchen lisinopril (PRINIVIL,ZESTRIL) 30 MG tablet Take 30 mg by mouth daily.      . Methylcellulose, Laxative, (CITRUCEL) 500 MG TABS Take 500 mg by mouth.      . Multiple Vitamins-Minerals (MULTIVITAMIN WITH MINERALS) tablet Take 1 tablet by mouth daily.      Marland Kitchen NAPROXEN PO Take 200 mg by mouth.      . NORTRIPTYLINE HCL PO Take 2 capsules by mouth at bedtime.       Marland Kitchen omega-3 acid ethyl esters (LOVAZA) 1 G capsule Take by mouth 2 (two) times daily.      . sertraline (ZOLOFT) 50 MG tablet        No current facility-administered medications for this visit.     Allergies as of 10/10/2013 - Review Complete 10/10/2013  Allergen Reaction Noted  . Codeine  06/14/2013  . Guaifenesin & derivatives  06/14/2013  . Penicillins  06/14/2013  . Sulfa antibiotics  06/14/2013    Vitals: BP 115/54  Pulse 65  Ht 5' 7.5" (1.715 m)  Wt 212 lb (96.163 kg)  BMI 32.69 kg/m2 Last Weight:  Wt Readings from Last 1 Encounters:  10/10/13 212 lb (96.163 kg)   Last Height:   Ht Readings from Last 1 Encounters:  10/10/13 5' 7.5" (1.715 m)     Physical exam: Exam: Gen: NAD, conversant Eyes: anicteric sclerae, moist conjunctivae HENT: Atraumatic, oropharynx clear Neck: Trachea midline; supple,  Lungs: CTA, no wheezing, rales, rhonic                          CV: RRR, no MRG Abdomen: Soft, non-tender;  Extremities: No peripheral edema  Skin: Normal temperature, no rash,  Psych: Appropriate affect, pleasant  Neuro: MS: AA&Ox3, appropriately interactive, normal affect   Attention: WORLD backwards  Speech: fluent w/o paraphasic error  Memory: good recent and remote recall  CN: PERRL, EOMI no nystagmus, no ptosis, sensation intact to LT V1-V3 bilat, face symmetric, no weakness, hearing grossly intact, palate elevates symmetrically, shoulder shrug 5/5 bilat,  tongue protrudes midline, no fasiculations noted.  Motor: normal bulk and tone Strength: 5/5  In all extremities  Coord: moderate bradykineisa with finger taps, hand opening, foot tapping bilateral. Minimal rest tremor L hand, mild postural and intention tremor bilaterally.   Reflexes: diminished, bilateral downgoing plantar response  Sens: LT intact in all extremities, intact propioception, vibration bilateral LE. No saddle anesthesia  Gait: slightly wide based, decreased arm swing bilaterally, unable to tandem, takes few steps back with pull test but corrects self, falls with Romberg with eyes closed   Assessment:  After physical and neurologic examination, review of laboratory  studies, imaging, neurophysiology testing and pre-existing records, assessment will be reviewed on the problem list.  Plan:  Treatment plan and additional workup will be reviewed under Problem List.  1)Tremor 2)Gait instability 3)Bladder incontinence   57y/o woman presenting for follow up evaluation of tremor and gait instability. History is also pertinent for some recent bladder incontinence and some subjective cognitive decline. At prior visit she was tried ona L-dopa trial and had a robust response. Still suspect that her RA is playing some role in her bradykinesia and walking difficulty. Will not make any changes to her Sinemet at this time. Would consider adding Azilect or DA in the  future if symptoms progress. Referral placed to PT for gait training. Follow up in 4 months with Dr Frances Furbish.    Elspeth Cho, DO  [facility] [address] [place], [place] [address]  Phone [phone] Fax [phone]

## 2014-02-11 ENCOUNTER — Encounter: Payer: Self-pay | Admitting: Diagnostic Neuroimaging

## 2014-02-11 ENCOUNTER — Ambulatory Visit: Payer: BC Managed Care – PPO | Admitting: Diagnostic Neuroimaging

## 2014-02-11 ENCOUNTER — Ambulatory Visit (INDEPENDENT_AMBULATORY_CARE_PROVIDER_SITE_OTHER): Payer: BC Managed Care – PPO | Admitting: Diagnostic Neuroimaging

## 2014-02-11 VITALS — BP 114/69 | HR 76 | Temp 97.1°F | Ht 68.0 in | Wt 206.0 lb

## 2014-02-11 DIAGNOSIS — R2681 Unsteadiness on feet: Secondary | ICD-10-CM

## 2014-02-11 DIAGNOSIS — R251 Tremor, unspecified: Secondary | ICD-10-CM

## 2014-02-11 DIAGNOSIS — G20A1 Parkinson's disease without dyskinesia, without mention of fluctuations: Secondary | ICD-10-CM

## 2014-02-11 DIAGNOSIS — F329 Major depressive disorder, single episode, unspecified: Secondary | ICD-10-CM

## 2014-02-11 DIAGNOSIS — F32A Depression, unspecified: Secondary | ICD-10-CM

## 2014-02-11 DIAGNOSIS — G44229 Chronic tension-type headache, not intractable: Secondary | ICD-10-CM

## 2014-02-11 DIAGNOSIS — G20 Parkinson's disease: Secondary | ICD-10-CM

## 2014-02-11 MED ORDER — CARBIDOPA-LEVODOPA 25-100 MG PO TABS: 1.5000 | ORAL_TABLET | Freq: Four times a day (QID) | ORAL | Status: DC

## 2014-02-11 MED ORDER — SERTRALINE HCL 25 MG PO TABS: 25.0000 mg | ORAL_TABLET | Freq: Every day | ORAL | Status: DC

## 2014-02-11 NOTE — Patient Instructions (Signed)
Start sertraline 25mg  daily.  Increase carbidopa/levodopa up to 1.5 tabs four times per day.  I will setup PT.

## 2014-02-11 NOTE — Progress Notes (Signed)
[facility]  PATIENT: Laura Lawrence DOB: 1956-02-10  REFERRING CLINICIAN:  HISTORY FROM: patient and husband  REASON FOR VISIT: new consult    HISTORICAL  CHIEF COMPLAINT:  Chief Complaint  Patient presents with  . Follow-up    PD, HA    HISTORY OF PRESENT ILLNESS:   UPDATE 02/11/14: Since last visit patient reports increasing headaches, constipation, bladder incontinence, early morning nausea, unsteadiness and progressive hair loss. Patient tolerating carbidopa/levodopa. 2-3 months ago she stopped her sertraline, which she had been taking for 15 years for anxiety/depression and headache treatment.  UPDATE 10/10/13 (PS): Overall doing well. Had one fall since last visit, notes her feet got tied up and wouldn't move. Notes doing well with the Sinemet. Currently taking Sinemet 25-100 QID at 8am, noon, 4pm and 8pm. Denies any motor fluctuations, no wearing off noted. No dyskinesias. Happy with current control, feels it has made a big difference. Sleeping well, minimal REM behavior disorder. Notes prior lab test showing elevated LFTs. Following up with rheumatology, they may consider tapering off of the methotrexate.   UPDATE 07/31/13 (PS): At last visit she had a MRI of the lumbar spine which showed some degenerative changes but no signs of compression. Continues to have difficulty with gait, notes episodes where she will start walking, "faster and faster" and not be able to stop until she falls over. Continues to have mild rest and postural tremor. Notes worsening handwriting, described as messy and smaller.   07/16/13 (PS):  At last visit she had a MRI of the brain and C spine both of which were overall unremarkable. Returns today with continued difficulty with her walking, feels very off balance, unsteady on her feet. Notes continued rest and postural/action tremor. No other acute issues at this time.   PRIOR HPI (06/17/13, Dr. Elspeth Cho): Laura Lawrence is a 58 y.o.  female here as a referral from Dr. Julio Sicks for tremors. Feels symptoms started around 2 months ago and has gotten progressively worse since then. Describes both a rest and action/postural tremor. Handwriting has gotten messier, no change in size. Notes some generalized slowing down of her movements. She feels off balance, having falls recently. Having difficulty with fine motor tasks. Notes some muscle cramps, aching in her legs. Tremor worse with stress/anxiety. No change with caffeine. Does not drink EtOH. No recent changes to her medications. Around Thanksgiving has noted loss of bladder control, states she will go without even knowing she was going. No family history of tremor. Has history of HTN, no known TIA or strokes in the past.    REVIEW OF SYSTEMS: Full 14 system review of systems performed and notable only for constipation nausea bladder incontinence headache weakness tremor.  ALLERGIES: Allergies  Allergen Reactions  . Codeine   . Guaifenesin & Derivatives   . Penicillins   . Sulfa Antibiotics     HOME MEDICATIONS: Outpatient Prescriptions Prior to Visit  Medication Sig Dispense Refill  . Adalimumab (HUMIRA) 40 MG/0.8ML PSKT Inject into the skin. Every two weeks.    . Cholecalciferol (VITAMIN D-3) 1000 UNITS CAPS Take 1,000 Units by mouth.    . DOCUSATE SODIUM PO Take 100 mg by mouth.    . Multiple Vitamins-Minerals (MULTIVITAMIN WITH MINERALS) tablet Take 1 tablet by mouth daily.    Marland Kitchen NAPROXEN PO Take 200 mg by mouth.    . NORTRIPTYLINE HCL PO Take 2 capsules by mouth at bedtime.     Marland Kitchen omega-3 acid ethyl esters (LOVAZA) 1  G capsule Take by mouth 2 (two) times daily.    . carbidopa-levodopa (SINEMET) 25-100 MG per tablet Take 1 tablet by mouth 4 (four) times daily. 120 tablet 6  . clindamycin (CLEOCIN) 300 MG capsule     . diphenhydrAMINE (BENADRYL) 25 MG tablet Take 25 mg by mouth every 6 (six) hours as needed.    . folic acid (FOLVITE) 800 MCG tablet Take 400 mcg by mouth  daily.    Marland Kitchen lisinopril (PRINIVIL,ZESTRIL) 30 MG tablet Take 30 mg by mouth daily.    . Methylcellulose, Laxative, (CITRUCEL) 500 MG TABS Take 500 mg by mouth.    . sertraline (ZOLOFT) 50 MG tablet      No facility-administered medications prior to visit.    PAST MEDICAL HISTORY: Past Medical History  Diagnosis Date  . Rheumatoid arthritis   . Hypercholesterolemia   . Hypertension   . Multiple allergies     PAST SURGICAL HISTORY: History reviewed. No pertinent past surgical history.  FAMILY HISTORY: Family History  Problem Relation Age of Onset  . Heart disease Father   . Osteoporosis Father   . Hypertension Father   . Leukemia Father   . Diabetes Mother   . Arthritis Mother   . Asthma Mother   . Hypertension Mother   . Bursitis Mother   . Gout    . Brain cancer Paternal Aunt     SOCIAL HISTORY:  History   Social History  . Marital Status: Married    Spouse Name: Casimiro Needle    Number of Children: 1  . Years of Education: college   Occupational History  . self employed    Social History Main Topics  . Smoking status: Never Smoker   . Smokeless tobacco: Never Used  . Alcohol Use: No  . Drug Use: No  . Sexual Activity: Not on file   Other Topics Concern  . Not on file   Social History Narrative   Patient is married Casimiro Needle), 2 children   Associates degree   Right handed   3 cups daily     PHYSICAL EXAM  Filed Vitals:   02/11/14 1406  BP: 114/69  Pulse: 76  Temp: 97.1 F (36.2 C)  TempSrc: Oral  Height: 5\' 8"  (1.727 m)  Weight: 206 lb (93.441 kg)    Body mass index is 31.33 kg/(m^2).  No exam data present  No flowsheet data found.  GENERAL EXAM: Patient is in no distress; well developed, nourished and groomed; neck is supple  CARDIOVASCULAR: Regular rate and rhythm, no murmurs, no carotid bruits  NEUROLOGIC: MENTAL STATUS: awake, alert, language fluent, comprehension intact, naming intact, fund of knowledge appropriate; MASKED  FACIES CRANIAL NERVE: no papilledema on fundoscopic exam, pupils equal and reactive to light, visual fields full to confrontation, extraocular muscles intact, no nystagmus, facial sensation and strength symmetric, hearing intact, palate elevates symmetrically, uvula midline, shoulder shrug symmetric, tongue midline; MASKED FACIES MOTOR: normal bulk; MILD RIGIDITY IN LUE > RUE; NO TREMOR. MOD BRADYKINESIA IN BUE AND BLE (L SLOWER THAN R); full strength in the BUE, BLE;  SENSORY: normal and symmetric to light touch  COORDINATION: finger-nose-finger, fine finger movements normal REFLEXES: deep tendon reflexes present and symmetric GAIT/STATION: narrow based gait; DECR ARM SWING    DIAGNOSTIC DATA (LABS, IMAGING, TESTING) - I reviewed patient records, labs, notes, testing and imaging myself where available.  No results found for: WBC, HGB, HCT, MCV, PLT No results found for: NA, K, CL, CO2, GLUCOSE, BUN, CREATININE, CALCIUM,  PROT, ALBUMIN, AST, ALT, ALKPHOS, BILITOT, GFRNONAA, GFRAA No results found for: CHOL, HDL, LDLCALC, LDLDIRECT, TRIG, CHOLHDL No results found for: KKXF8H Lab Results  Component Value Date   VITAMINB12 >1999* 07/16/2013   Lab Results  Component Value Date   TSH 2.320 07/16/2013    I reviewed images myself and agree with interpretation. -[facility]P  07/09/13 MRI brain (without) demonstrating: 1. Few (~4) punctate foci of periventricular and subcortical non-specific gliosis. 2. No acute findings.  07/09/13 MRI cervical spine (without) demonstrating mild disc bulging at C3-4 and C4-5. No spinal stenosis or foraminal narrowing. No intrinsic or compressive spinal cord lesions.  07/23/13 MRI lumbar spine (without) demonstrating: 1. At L4-5: disc bulging and facet and ligamentum flavum hypertrophy with moderate spinal stenosis and mild biforaminal stenosis  2. At L5-S1: disc bulging and facet arthropathy, with mild spinal stenosis, left lateral recess stenosis, mild right and  moderate left foraminal stenosis  3. At L3-4: disc bulging, leftward disc protrusion, facet and ligamentum flavum hypertrophy with mild spinal stenosis and mild biforaminal stenosis      ASSESSMENT AND PLAN  58 y.o. year old female here with parkinson's disease, lumbar spinal stenosis, depression and rheumatoid arthritis.   PLAN: - restart sertraline for depression (was also helping preventing her headaches) - PT evaluation for parkinsonism - increase carb/levo 25/100 up to 4-6 tabs per day (divided 4-5 times per day)   Orders Placed This Encounter  Procedures  . Ambulatory referral to Physical Therapy    Meds ordered this encounter  Medications  . sertraline (ZOLOFT) 25 MG tablet    Sig: Take 1 tablet (25 mg total) by mouth daily.    Dispense:  30 tablet    Refill:  12  . carbidopa-levodopa (SINEMET) 25-100 MG per tablet    Sig: Take 1.5 tablets by mouth 4 (four) times daily.    Dispense:  180 tablet    Refill:  12    Return in about 3 months (around 05/13/2014).    Suanne Marker, MD 02/11/2014, 2:55 PM Certified in Neurology, Neurophysiology and Neuroimaging  [facility] [address] [place], [place] [address] [phone]

## 2014-02-24 ENCOUNTER — Encounter: Payer: Self-pay | Admitting: Diagnostic Neuroimaging

## 2014-02-25 ENCOUNTER — Telehealth: Payer: Self-pay

## 2014-02-25 NOTE — Telephone Encounter (Signed)
Called patient and let her know PT referral received at [facility], staff working on order, she should be contacted in a 1-3 days. Patient agreed.

## 2014-03-11 ENCOUNTER — Encounter: Payer: Self-pay | Admitting: Physical Therapy

## 2014-03-11 ENCOUNTER — Ambulatory Visit: Payer: BLUE CROSS/BLUE SHIELD | Attending: Diagnostic Neuroimaging | Admitting: Physical Therapy

## 2014-03-11 DIAGNOSIS — R269 Unspecified abnormalities of gait and mobility: Secondary | ICD-10-CM | POA: Insufficient documentation

## 2014-03-11 DIAGNOSIS — R293 Abnormal posture: Secondary | ICD-10-CM | POA: Insufficient documentation

## 2014-03-11 NOTE — Therapy (Signed)
[facility] [address] [place], [place], [address] Phone: [phone]   Fax:  [phone]  Physical Therapy Treatment  Patient Details  Name: Laura Lawrence MRN: [id number] Date of Birth: 1956-01-17 Referring Provider:  Jackie Plum, MD  Encounter Date: 03/11/2014      PT End of Session - 03/11/14 1712    Visit Number 1   Number of Visits 9   Date for PT Re-Evaluation 05/10/14   Authorization Type BCBS   PT Start Time 0932   PT Stop Time 1016   PT Time Calculation (min) 44 min      Past Medical History  Diagnosis Date  . Rheumatoid arthritis   . Hypercholesterolemia   . Hypertension   . Multiple allergies     History reviewed. No pertinent past surgical history.  There were no vitals taken for this visit.  Visit Diagnosis:  Abnormality of gait - Plan: PT plan of care cert/re-cert  Abnormal posture - Plan: PT plan of care cert/re-cert      Subjective Assessment - 03/11/14 0940    Symptoms Pt is a 59 year old female who presents to OP PT with Parkinsonism, with diagnosis approximately 6 months ago.  She has had slight increases in Sinemet. She reports incontinence, constipation, balance issues, and falls.  She has not fallen in the past 4-5 weeks.  She has had at least 5 falls in the past 6 months.  Falls occurred during walking, where she loses balance on inclines, steps and outdoor surfaces.  Pt does not use assistive device.   Pertinent History Parkinson's disease diagnosed at least 6 months ago   Patient Stated Goals Pt's goals for therapy are to increase confidence with independence with gait and with stairs.   Currently in Pain? No/denies          [facility] PT Assessment - 03/11/14 0947    Assessment   Medical Diagnosis Parkinson's disease   Precautions   Precautions Fall   Balance Screen   Has the patient fallen in the past 6 months Yes   How many times? 6  at least 6 falls in the past  6 months   Has the patient had a decrease in activity level because of a fear of falling?  Yes   Is the patient reluctant to leave their home because of a fear of falling?  Yes   Home Environment   Living Enviornment Private residence   Living Arrangements Spouse/significant other   Available Help at Discharge Family   Type of Home House   Home Access Stairs to enter   Entrance Stairs-Number of Steps 5   Entrance Stairs-Rails Left   Home Layout Multi-level   Alternate Level Stairs-Number of Steps 3  then 20 steps up to bedroom   Alternate Level Stairs-Rails Right   Home Equipment [place] - single point;Walker - 4 wheels  Elevated toilet seat   Prior Function   Level of Independence Independent with basic ADLs;Independent with gait;Independent with transfers  Reports it is taking extra time for transfers, dressing   Vocation --  Primary school teacher, artist; travels weekend shows   Leisure No current exercise programs   Observation/Other Assessments   Focus on Therapeutic Outcomes (FOTO)  Functional Status Intake score 34; Neuro QOL 32.1   Strength   Overall Strength Comments within functional limits bilaterally at least 4/5   Transfers   Transfers Sit to Stand;Stand to Sit   Sit to Stand Five times sit  to stand;With upper extremity assist;5: Supervision;From chair/3-in-1  unable without UE support   Ambulation/Gait   Ambulation/Gait Yes   Ambulation/Gait Assistance 5: Supervision   Ambulation Distance (Feet) 200 Feet   Assistive device None   Gait Pattern Trunk flexed  L foot slap, decreased bilateral arm swing, hastening gait   Ambulation Surface Indoor   Gait velocity 9.58 sec=3.42 ft/sec   Stairs Yes   Stairs Assistance 6: Modified independent (Device/Increase time)   Stair Management Technique Two rails;Alternating pattern   Number of Stairs 4   Gait Comments Pt ambulates with forward flexed posture, with L foot slap, decreased L foot clearance, decr. arm swing, decreased  timing and coordination of gait.l   Standardized Balance Assessment   Standardized Balance Assessment Timed Up and Go Test   Timed Up and Go Test   TUG Manual TUG;Cognitive TUG   Normal TUG (seconds) 11.83  forward flexed posture/decr. timing with gait   Manual TUG (seconds) 10.27   Cognitive TUG (seconds) 11.77   Functional Gait  Assessment   Gait assessed  Yes  Scores <22/30 indicate increased fall risk   Gait Level Surface Walks 20 ft in less than 7 sec but greater than 5.5 sec, uses assistive device, slower speed, mild gait deviations, or deviates 6-10 in outside of the 12 in walkway width.  5.78 sec   Change in Gait Speed Able to smoothly change walking speed without loss of balance or gait deviation. Deviate no more than 6 in outside of the 12 in walkway width.   Gait with Horizontal Head Turns Performs head turns with moderate changes in gait velocity, slows down, deviates 10-15 in outside 12 in walkway width but recovers, can continue to walk.  veers to right and left   Gait with Vertical Head Turns Performs task with slight change in gait velocity (eg, minor disruption to smooth gait path), deviates 6 - 10 in outside 12 in walkway width or uses assistive device   Gait and Pivot Turn Pivot turns safely within 3 sec and stops quickly with no loss of balance.   Step Over Obstacle Is able to step over 2 stacked shoe boxes taped together (9 in total height) without changing gait speed. No evidence of imbalance.   Gait with Narrow Base of Support Ambulates less than 4 steps heel to toe or cannot perform without assistance.   Gait with Eyes Closed Walks 20 ft, uses assistive device, slower speed, mild gait deviations, deviates 6-10 in outside 12 in walkway width. Ambulates 20 ft in less than 9 sec but greater than 7 sec.  8.18 seconds   Ambulating Backwards Walks 20 ft, uses assistive device, slower speed, mild gait deviations, deviates 6-10 in outside 12 in walkway width.  6.74 sec;  several steps to catch balance when stopping   Steps Alternating feet, must use rail.   Total Score 20                               PT Long Term Goals - 03/11/14 1717    PT LONG TERM GOAL #1   Title Pt will be independent with HEP to address transfers, gait and balance. (Target 04/11/14)   Baseline No current HEP   Time 4   Period Weeks   Status New   PT LONG TERM GOAL #2   Title perform at least 8 of 10 reps of sit<>stand transfers with minimal to no UE  support, for improved transfer efficiency and safety.   Time 4   Period Weeks   Status New   PT LONG TERM GOAL #3   Title improve Functional Gait Assessment to at least 22/30 for decreased fall risk.   Time 4   Period Weeks   Status New   PT LONG TERM GOAL #4   Title verbalize/demonstrate understanding of techniques to reduce hastening/freezing episodes with gait for decreased risk of falls.   Time 4   Period Weeks   Status New   PT LONG TERM GOAL #5   Title verbalize plans for continued community fitness upon D/C from PT.   Time 4   Period Weeks   Status New               Plan - 03/11/14 1713    Clinical Impression Statement Pt is a 59 year old female who presents with hastening of gait, decreased timing and coordination of gait, bilateral UE tremor, hsitory of falls, reports of occasional freezing, with reports of worsening incontinence.  Pt reports she does note improvement of symptoms on Sinemet and can definitely tell when she has missed a dose.  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  She desires to work with PT to improve confidence with balance and gait and reduce falls.   Pt will benefit from skilled therapeutic intervention in order to improve on the following deficits Abnormal gait;Decreased balance;Difficulty walking;Postural dysfunction   Rehab Potential Good   PT Frequency 2x / week   PT Duration 4  weeks  plus evaluation   PT Treatment/Interventions ADLs/Self Care Home Management;Therapeutic activities;Functional mobility training;Stair training;Gait training;Therapeutic exercise;Neuromuscular re-education;Balance training;Patient/family education   Recommended Other Services Recommend OT evaluation   Consulted and Agree with Plan of Care Patient        Problem List Patient Active Problem List   Diagnosis Date Noted  . Tremor 06/17/2013    Valera Vallas W. 03/11/2014, 5:25 PM  Kazaria Gaertner Bernie Covey, PT 03/11/2014 5:25 PM Phone: [phone] Fax: [phone]  [facility] [address] [place], [place], [address] Phone: [phone]   Fax:  [phone]

## 2014-03-19 ENCOUNTER — Ambulatory Visit: Payer: BLUE CROSS/BLUE SHIELD | Attending: Diagnostic Neuroimaging | Admitting: Physical Therapy

## 2014-03-19 ENCOUNTER — Encounter: Payer: Self-pay | Admitting: Physical Therapy

## 2014-03-19 DIAGNOSIS — R269 Unspecified abnormalities of gait and mobility: Secondary | ICD-10-CM | POA: Diagnosis not present

## 2014-03-19 DIAGNOSIS — R293 Abnormal posture: Secondary | ICD-10-CM | POA: Diagnosis not present

## 2014-03-19 NOTE — Patient Instructions (Addendum)

## 2014-03-19 NOTE — Therapy (Signed)
[facility] [address] [place], [place], [address] Phone: [phone]   Fax:  [phone]  Physical Therapy Treatment  Patient Details  Name: Laura Lawrence MRN: [id number] Date of Birth: Jun 17, 1955 Referring Provider:  Jackie Plum, MD  Encounter Date: 03/19/2014      PT End of Session - 03/19/14 1159    Visit Number 2   Number of Visits 9   Date for PT Re-Evaluation 05/10/14   Authorization Type BCBS   PT Start Time 1104   PT Stop Time 1153   PT Time Calculation (min) 49 min   Behavior During Therapy [facility] for tasks assessed/performed      Past Medical History  Diagnosis Date  . Rheumatoid arthritis   . Hypercholesterolemia   . Hypertension   . Multiple allergies     History reviewed. No pertinent past surgical history.  There were no vitals taken for this visit.  Visit Diagnosis:  Abnormality of gait  Abnormal posture      Subjective Assessment - 03/19/14 1115    Symptoms Pt reports her feet are "sticking" to the ground today.  Reports freezing episodes.  Denies falls since last visit.   Currently in Pain? No/denies                    [facility] PT Treatment/Exercise - 03/19/14 0001    Knee/Hip Exercises: Aerobic   Stationary Bike Scifit level 1.5 all 4 extremities x 10 minutes for flexibility           [facility]) - 03/19/14 1128    PWR! exercises Moves in supine   PWR! Up 20   PWR! Rock 20   PWR! Twist 20   PWR! Step 20   Comments verbal cues for intensity and technique             PT Education - 03/19/14 1157    Education provided Yes   Education Details Supine PWR!, Power over Parkinsons Group, Importance of Exercise with PD, Tips to reduce freezing   Person(s) Educated Patient   Methods Explanation;Demonstration;Handout   Comprehension Verbalized understanding             PT Long Term Goals - 03/11/14 1717    PT LONG TERM GOAL #1   Title Pt  will be independent with HEP to address transfers, gait and balance. (Target 04/11/14)   Baseline No current HEP   Time 4   Period Weeks   Status New   PT LONG TERM GOAL #2   Title perform at least 8 of 10 reps of sit<>stand transfers with minimal to no UE support, for improved transfer efficiency and safety.   Time 4   Period Weeks   Status New   PT LONG TERM GOAL #3   Title improve Functional Gait Assessment to at least 22/30 for decreased fall risk.   Time 4   Period Weeks   Status New   PT LONG TERM GOAL #4   Title verbalize/demonstrate understanding of techniques to reduce hastening/freezing episodes with gait for decreased risk of falls.   Time 4   Period Weeks   Status New   PT LONG TERM GOAL #5   Title verbalize plans for continued community fitness upon D/C from PT.   Time 4   Period Weeks   Status New               Plan - 03/19/14 1159    Clinical Impression  Statement Pt tearful discussing PD diagnosis and has not shared with any friends yet, only immediate family.  Pt very motivated to improve functional mobility.  Reports difficulty with floor to chair transfers, freeezing and L foot clearance.  continue PT per POC.   Pt will benefit from skilled therapeutic intervention in order to improve on the following deficits Abnormal gait;Decreased balance;Difficulty walking;Postural dysfunction   Rehab Potential Good   PT Frequency 2x / week   PT Duration 4 weeks   PT Treatment/Interventions ADLs/Self Care Home Management;Therapeutic activities;Functional mobility training;Stair training;Gait training;Therapeutic exercise;Neuromuscular re-education;Balance training;Patient/family education   PT Next Visit Plan Review supine PWR!  Add seated and standing PWR!   Consulted and Agree with Plan of Care Patient        Problem List Patient Active Problem List   Diagnosis Date Noted  . Tremor 06/17/2013    Newell Coral 03/19/2014, 12:05 PM  [facility] [address] [place], [place], [address] Phone: [phone]   Fax:  [phone]     Maxcine Ham [facility] 03/19/2014 12:05 PM Phone: [phone] Fax: [phone]

## 2014-03-20 ENCOUNTER — Encounter: Payer: Self-pay | Admitting: Physical Therapy

## 2014-03-20 ENCOUNTER — Ambulatory Visit: Payer: BLUE CROSS/BLUE SHIELD | Admitting: Physical Therapy

## 2014-03-20 DIAGNOSIS — R293 Abnormal posture: Secondary | ICD-10-CM

## 2014-03-20 DIAGNOSIS — R269 Unspecified abnormalities of gait and mobility: Secondary | ICD-10-CM

## 2014-03-20 NOTE — Therapy (Signed)
[facility] [address] [place], [place], [address] Phone: [phone]   Fax:  [phone]  Physical Therapy Treatment  Patient Details  Name: Laura Lawrence MRN: [id number] Date of Birth: 09-27-1955 Referring Provider:  Jackie Plum, MD  Encounter Date: 03/20/2014      PT End of Session - 03/20/14 1048    Visit Number 3   Number of Visits 9   Date for PT Re-Evaluation 05/10/14   Authorization Type BCBS   PT Start Time 0929   PT Stop Time 1022   PT Time Calculation (min) 53 min   Behavior During Therapy [facility] for tasks assessed/performed      Past Medical History  Diagnosis Date  . Rheumatoid arthritis   . Hypercholesterolemia   . Hypertension   . Multiple allergies     History reviewed. No pertinent past surgical history.  There were no vitals taken for this visit.  Visit Diagnosis:  Abnormality of gait  Abnormal posture      Subjective Assessment - 03/20/14 0932    Symptoms Pt reports being a little sore from exercises yesterday.   Currently in Pain? Yes   Pain Score 4    Pain Location Knee   Pain Orientation Left   Pain Descriptors / Indicators Aching   Pain Type Chronic pain   Pain Onset More than a month ago   Pain Frequency Occasional   Pain Relieving Factors Aleve   Multiple Pain Sites No                       [facility]) - 03/20/14 9371    PWR! exercises Moves in sitting;Moves in supine;Moves in prone;Moves in standing   PWR! Up 15   PWR! Rock 16   PWR! Twist 16   PWR! Step 16   Comments minimal cues needed to reinforce technique and intensity   PWR! Up 10   PWR! Rock 10   PWR! Twist 10   PWR! Step 10   PWR! Up 20   PWR! Rock 20   PWR! Twist 20   PWR Step 20   Comments Encouraged to use counter or chair if needed for balance   PWR! Up 20   PWR! Rock 20   PWR! Twist 20   PWR! Step 20             PT Education - 03/20/14 1047    Education  provided Yes   Education Details Heel strike with gait, [employer] Education session, PWR! moves seated, standing, supine and prone, [employer] spin class   Person(s) Educated Patient   Methods Explanation;Demonstration;Verbal cues;Handout   Comprehension Verbalized understanding;Need further instruction             PT Long Term Goals - 03/11/14 1717    PT LONG TERM GOAL #1   Title Pt will be independent with HEP to address transfers, gait and balance. (Target 04/11/14)   Baseline No current HEP   Time 4   Period Weeks   Status New   PT LONG TERM GOAL #2   Title perform at least 8 of 10 reps of sit<>stand transfers with minimal to no UE support, for improved transfer efficiency and safety.   Time 4   Period Weeks   Status New   PT LONG TERM GOAL #3   Title improve Functional Gait Assessment to at least 22/30 for decreased fall risk.   Time 4  Period Weeks   Status New   PT LONG TERM GOAL #4   Title verbalize/demonstrate understanding of techniques to reduce hastening/freezing episodes with gait for decreased risk of falls.   Time 4   Period Weeks   Status New   PT LONG TERM GOAL #5   Title verbalize plans for continued community fitness upon D/C from PT.   Time 4   Period Weeks   Status New               Plan - 03/20/14 1049    Clinical Impression Statement Pt tolerated treatment well today with no rest breaks.  Continue PT per POC.   Pt will benefit from skilled therapeutic intervention in order to improve on the following deficits Abnormal gait;Decreased balance;Difficulty walking;Postural dysfunction   Rehab Potential Good   PT Frequency 2x / week   PT Duration 4 weeks   PT Treatment/Interventions ADLs/Self Care Home Management;Therapeutic activities;Functional mobility training;Stair training;Gait training;Therapeutic exercise;Neuromuscular re-education;Balance training;Patient/family education   PT Next Visit Plan Review seated, prone and standing PWR!   Add quadriped.  Gait with heel strike and arm swing.   Consulted and Agree with Plan of Care Patient        Problem List Patient Active Problem List   Diagnosis Date Noted  . Tremor 06/17/2013    Newell Coral 03/20/2014, 10:51 AM  [facility] [address] [place], [place], [address] Phone: [phone]   Fax:  [phone]     Newell Coral, [place] [facility] 03/20/2014 10:51 AM Phone: [phone] Fax: [phone]

## 2014-03-25 ENCOUNTER — Ambulatory Visit: Payer: BLUE CROSS/BLUE SHIELD | Admitting: Physical Therapy

## 2014-03-25 DIAGNOSIS — R269 Unspecified abnormalities of gait and mobility: Secondary | ICD-10-CM | POA: Diagnosis not present

## 2014-03-25 DIAGNOSIS — R293 Abnormal posture: Secondary | ICD-10-CM

## 2014-03-25 NOTE — Therapy (Signed)
[facility] [address] [place], [place], [address] Phone: [phone]   Fax:  [phone]  Physical Therapy Treatment  Patient Details  Name: Laura Lawrence MRN: [id number] Date of Birth: 1955-12-22 Referring Provider:  Jackie Plum, MD  Encounter Date: 03/25/2014      PT End of Session - 03/25/14 1243    Visit Number 4   Number of Visits 9   Date for PT Re-Evaluation 05/10/14   Authorization Type BCBS   PT Start Time 1148   PT Stop Time 1232   PT Time Calculation (min) 44 min   Behavior During Therapy [facility] for tasks assessed/performed      Past Medical History  Diagnosis Date  . Rheumatoid arthritis   . Hypercholesterolemia   . Hypertension   . Multiple allergies     No past surgical history on file.  There were no vitals taken for this visit.  Visit Diagnosis:  Abnormality of gait  Abnormal posture      Subjective Assessment - 03/25/14 1204    Symptoms Pt denies falls.  Has been performing HEP.  Was a little sore.   Currently in Pain? Yes   Pain Score 3    Pain Location Hip   Pain Orientation Right;Left   Pain Descriptors / Indicators Aching   Pain Type Chronic pain   Pain Onset More than a month ago   Pain Frequency Occasional   Pain Relieving Factors Aleve   Multiple Pain Sites No                       [facility]) - 03/25/14 1240    PWR! exercises Moves in sitting;Moves in [place];Moves in prone;Moves in standing   PWR! Up 20   PWR! Rock 20   PWR! Twist 20   PWR! Step 20   Comments Used UE support for step   PWR! Up 10   PWR! Rock 10   PWR! Twist 10   PWR! Step 10   PWR! Up 10   PWR! Rock 10   PWR! Twist 10   PWR Step 10   Comments Used counter for UE support   PWR! Up 10   PWR! Rock 10   PWR! Twist 10   PWR! Step 10      Performed floor to standing transfer with bil and 1 UE support focusing on BOS.       PT Education - 03/25/14 1242    Education provided Yes   Education Details quadriped PWR!, floor transfers   Person(s) Educated Patient   Methods Explanation;Demonstration;Verbal cues;Handout   Comprehension Verbalized understanding             PT Long Term Goals - 03/11/14 1717    PT LONG TERM GOAL #1   Title Pt will be independent with HEP to address transfers, gait and balance. (Target 04/11/14)   Baseline No current HEP   Time 4   Period Weeks   Status New   PT LONG TERM GOAL #2   Title perform at least 8 of 10 reps of sit<>stand transfers with minimal to no UE support, for improved transfer efficiency and safety.   Time 4   Period Weeks   Status New   PT LONG TERM GOAL #3   Title improve Functional Gait Assessment to at least 22/30 for decreased fall risk.   Time 4   Period Weeks   Status New   PT LONG TERM  GOAL #4   Title verbalize/demonstrate understanding of techniques to reduce hastening/freezing episodes with gait for decreased risk of falls.   Time 4   Period Weeks   Status New   PT LONG TERM GOAL #5   Title verbalize plans for continued community fitness upon D/C from PT.   Time 4   Period Weeks   Status New               Plan - 03/25/14 1246    Clinical Impression Statement Pt performing PWR! exercises at home and feels they are helping her mobility.  Continue PT per POC>   Pt will benefit from skilled therapeutic intervention in order to improve on the following deficits Abnormal gait;Decreased balance;Difficulty walking;Postural dysfunction   Rehab Potential Good   PT Frequency 2x / week   PT Duration 4 weeks   PT Treatment/Interventions ADLs/Self Care Home Management;Therapeutic activities;Functional mobility training;Stair training;Gait training;Therapeutic exercise;Neuromuscular re-education;Balance training;Patient/family education   PT Next Visit Plan Review quadriped PWR!Marland Kitchen  Gait with heel strike and arm swing.  Optimal community fitness options.   Consulted and Agree  with Plan of Care Patient        Problem List Patient Active Problem List   Diagnosis Date Noted  . Tremor 06/17/2013    Newell Coral 03/25/2014, 12:49 PM  [facility] [address] [place], [place], [address] Phone: [phone]   Fax:  [phone]     Newell Coral, [place] [facility] 03/25/2014 12:49 PM Phone: [phone] Fax: [phone]

## 2014-03-27 ENCOUNTER — Ambulatory Visit: Payer: BLUE CROSS/BLUE SHIELD | Admitting: Physical Therapy

## 2014-03-27 ENCOUNTER — Encounter: Payer: Self-pay | Admitting: Physical Therapy

## 2014-03-27 DIAGNOSIS — R269 Unspecified abnormalities of gait and mobility: Secondary | ICD-10-CM

## 2014-03-27 DIAGNOSIS — R293 Abnormal posture: Secondary | ICD-10-CM

## 2014-03-27 NOTE — Therapy (Signed)
[facility] [address] [place], [place], [address] Phone: [phone]   Fax:  [phone]  Physical Therapy Treatment  Patient Details  Name: Laura Lawrence MRN: [id number] Date of Birth: 01-12-1956 Referring Provider:  Jackie Plum, MD  Encounter Date: 03/27/2014      PT End of Session - 03/27/14 1025    Visit Number 5   Number of Visits 9   Date for PT Re-Evaluation 05/10/14   Authorization Type BCBS   PT Start Time [phone]   PT Stop Time 1017   PT Time Calculation (min) 43 min   Equipment Utilized During Treatment Gait belt   Behavior During Therapy [facility] for tasks assessed/performed      Past Medical History  Diagnosis Date  . Rheumatoid arthritis   . Hypercholesterolemia   . Hypertension   . Multiple allergies     History reviewed. No pertinent past surgical history.  There were no vitals taken for this visit.  Visit Diagnosis:  Abnormality of gait  Abnormal posture      Subjective Assessment - 03/27/14 0943    Symptoms Pt sore from exercises.   Currently in Pain? No/denies                    [facility] Adult PT Treatment/Exercise - 03/27/14 0945    Ambulation/Gait   Ambulation/Gait Yes   Ambulation/Gait Assistance 5: Supervision   Ambulation/Gait Assistance Details worked on heel strike, arm swing, intensity.  Use walking poles with PTA holding ends to assist with arm swing and with pt using poles.  Pt quick to pick up on sequence with walking poles.  Would get out of sequence but able to regain sequence without cues.   Ambulation Distance (Feet) 600 Feet  twice and 140 x 2   Assistive device None;Other (Comment)  walking poles   Gait Pattern Step-through pattern;Decreased dorsiflexion - left  decreased arm swing   Ambulation Surface Level;Indoor   Knee/Hip Exercises: Aerobic   Stationary Bike Scifit level 2.0 all 4 extremities x 10 minutes for flexibility and endurance   Tread Mill Treadmill at 1.6 mph x 8 minutes working on heel strike and step length with UE support.                PT Education - 03/27/14 1024    Education provided Yes   Education Details Optimal community fitness program, gait with walking poles   Person(s) Educated Patient   Methods Explanation;Demonstration;Verbal cues;Handout   Comprehension Verbalized understanding;Returned demonstration             PT Long Term Goals - 03/11/14 1717    PT LONG TERM GOAL #1   Title Pt will be independent with HEP to address transfers, gait and balance. (Target 04/11/14)   Baseline No current HEP   Time 4   Period Weeks   Status New   PT LONG TERM GOAL #2   Title perform at least 8 of 10 reps of sit<>stand transfers with minimal to no UE support, for improved transfer efficiency and safety.   Time 4   Period Weeks   Status New   PT LONG TERM GOAL #3   Title improve Functional Gait Assessment to at least 22/30 for decreased fall risk.   Time 4   Period Weeks   Status New   PT LONG TERM GOAL #4   Title verbalize/demonstrate understanding of techniques to reduce hastening/freezing episodes with gait for decreased risk of falls.  Time 4   Period Weeks   Status New   PT LONG TERM GOAL #5   Title verbalize plans for continued community fitness upon D/C from PT.   Time 4   Period Weeks   Status New               Plan - 03/27/14 1025    Clinical Impression Statement Pt performing exercises at home.  Responds well to verbal cues to improve gait pattern.  Continue PT per POC.   Pt will benefit from skilled therapeutic intervention in order to improve on the following deficits Abnormal gait;Decreased balance;Difficulty walking;Postural dysfunction   Rehab Potential Good   PT Frequency 2x / week   PT Duration 4 weeks   PT Treatment/Interventions ADLs/Self Care Home Management;Therapeutic activities;Functional mobility training;Stair training;Gait training;Therapeutic  exercise;Neuromuscular re-education;Balance training;Patient/family education   PT Next Visit Plan Review quadriped PWR!  Gait outdoor with/without poles depending on weather.  Cognitive tasks with gait.   Consulted and Agree with Plan of Care Patient        Problem List Patient Active Problem List   Diagnosis Date Noted  . Tremor 06/17/2013    Newell Coral 03/27/2014, 10:28 AM  [facility] [address] [place], [place], [address] Phone: [phone]   Fax:  [phone]     Newell Coral, [place] [facility] 03/27/2014 10:29 AM Phone: [phone] Fax: [phone]

## 2014-03-27 NOTE — Patient Instructions (Signed)
Optimal Community Fitness Options:  *Exercises from therapy daily  *Walking program 5 times a week focusing on intensity and posture (including head) and arm swing.  Work up to 30 minutes.  *Cardio (recumbent bike, treadmill, water aerobics) 3 times a week.  Work up to 30 minutes.  Can use walking poles for walking outdoors

## 2014-04-01 ENCOUNTER — Ambulatory Visit: Payer: BLUE CROSS/BLUE SHIELD | Admitting: Physical Therapy

## 2014-04-01 ENCOUNTER — Encounter: Payer: Self-pay | Admitting: Physical Therapy

## 2014-04-01 ENCOUNTER — Other Ambulatory Visit: Payer: Self-pay | Admitting: *Deleted

## 2014-04-01 DIAGNOSIS — R293 Abnormal posture: Secondary | ICD-10-CM

## 2014-04-01 DIAGNOSIS — R269 Unspecified abnormalities of gait and mobility: Secondary | ICD-10-CM

## 2014-04-01 DIAGNOSIS — G20 Parkinson's disease: Secondary | ICD-10-CM

## 2014-04-01 NOTE — Therapy (Signed)
[facility] [address] [place], [place], [address] Phone: [phone]   Fax:  [phone]  Physical Therapy Treatment  Patient Details  Name: Laura Lawrence MRN: [id number] Date of Birth: 1955-03-18 Referring Provider:  Jackie Plum, MD  Encounter Date: 04/01/2014      PT End of Session - 04/01/14 1304    Visit Number 6   Number of Visits 9   Date for PT Re-Evaluation 05/10/14   Authorization Type BCBS   PT Start Time 0930   PT Stop Time 1017   PT Time Calculation (min) 47 min   Equipment Utilized During Treatment Gait belt   Behavior During Therapy [facility] for tasks assessed/performed      Past Medical History  Diagnosis Date  . Rheumatoid arthritis   . Hypercholesterolemia   . Hypertension   . Multiple allergies     History reviewed. No pertinent past surgical history.  There were no vitals taken for this visit.  Visit Diagnosis:  Abnormality of gait  Abnormal posture      Subjective Assessment - 04/01/14 1020    Symptoms Did some exercises but did not walk-away for the weekend and not a good place to walk.   Currently in Pain? Yes   Pain Score 3    Pain Location Knee   Pain Orientation Left   Pain Descriptors / Indicators Aching   Pain Type Chronic pain   Pain Onset More than a month ago   Pain Relieving Factors Aleve   Multiple Pain Sites No                    [facility] Adult PT Treatment/Exercise - 04/01/14 1024    Knee/Hip Exercises: Aerobic   Stationary Bike Scifit level 2.5 all 4 extremities x 10 minutes working on 30 second bouts of increased rpm   Tread Mill treadmill at 1.7 mph x 8 minutes with UE support working on gait pattern and performing cognitive task.           [facility]) - 04/01/14 1031    PWR! exercises Moves in [place]! Up 20   PWR! Rock 20   PWR! Twist 20   PWR! Step 20                  PT Long Term Goals - 03/11/14 1717    PT LONG TERM GOAL #1   Title Pt will be independent with HEP to address transfers, gait and balance. (Target 04/11/14)   Baseline No current HEP   Time 4   Period Weeks   Status New   PT LONG TERM GOAL #2   Title perform at least 8 of 10 reps of sit<>stand transfers with minimal to no UE support, for improved transfer efficiency and safety.   Time 4   Period Weeks   Status New   PT LONG TERM GOAL #3   Title improve Functional Gait Assessment to at least 22/30 for decreased fall risk.   Time 4   Period Weeks   Status New   PT LONG TERM GOAL #4   Title verbalize/demonstrate understanding of techniques to reduce hastening/freezing episodes with gait for decreased risk of falls.   Time 4   Period Weeks   Status New   PT LONG TERM GOAL #5   Title verbalize plans for continued community fitness upon D/C from PT.   Time 4   Period Weeks   Status New  Plan - 04/01/14 1058    Clinical Impression Statement Continue PT per POC.   Pt will benefit from skilled therapeutic intervention in order to improve on the following deficits Abnormal gait;Decreased balance;Difficulty walking;Postural dysfunction   Rehab Potential Good   PT Frequency 2x / week   PT Duration 4 weeks   PT Treatment/Interventions ADLs/Self Care Home Management;Therapeutic activities;Functional mobility training;Stair training;Gait training;Therapeutic exercise;Neuromuscular re-education;Balance training;Patient/family education   PT Next Visit Plan Pt wants to practice supine/prone PWR! and simulated bed mobility (moving covers, scooting).  Balance/reaching activities. L Ankle dorsiflexion with theraband for strengthening.  Discuss possible discharge next  week.   Consulted and Agree with Plan of Care Patient        Problem List Patient Active Problem List   Diagnosis Date Noted  . Tremor 06/17/2013    Newell Coral 04/01/2014, 1:10 PM  [facility] [address] [place], [place], [address] Phone: [phone]   Fax:  [phone]     Newell Coral, [place] [facility] 04/01/2014 1:10 PM Phone: [phone] Fax: [phone]

## 2014-04-03 ENCOUNTER — Ambulatory Visit: Payer: BLUE CROSS/BLUE SHIELD | Admitting: Physical Therapy

## 2014-04-03 DIAGNOSIS — R269 Unspecified abnormalities of gait and mobility: Secondary | ICD-10-CM | POA: Diagnosis not present

## 2014-04-03 DIAGNOSIS — R293 Abnormal posture: Secondary | ICD-10-CM

## 2014-04-03 NOTE — Patient Instructions (Signed)
Heel Cord Stretch   Place one leg forward, bent, other leg behind and straight. Lean forward keeping back heel flat. Hold _30___ seconds while counting out loud. Repeat with other leg. Repeat _3___ times each leg. Do __2-3__ sessions per day.  http://gt2.exer.us/512   Copyright  VHI. All rights reserved.  Gastroc / Plantar Fascia, Standing   Stand, one foot on 2 inch block (like a phone book), heel resting on floor. Keep toes straight and hands on counter. With leg straight, press entire body forward, shifting weight forward through your hips. Hold _30_ seconds. Repeat _3__ times per session, each leg. Do __2-3_ sessions per day.  Copyright  VHI. All rights reserved.  Ankle Bend (Dorsiflexion) with theraband   Sitting, place the theraband across your left foot.  Hold the band down with your right foot.  Against the resistance of the band point toes up, keeping both heels on floor.  Repeat _10___ times, work up to 3 sets. Do __1-2__ sessions per day.  http://gt2.exer.us/404   Copyright  VHI. All rights reserved.

## 2014-04-03 NOTE — Therapy (Signed)
[facility] [address] [place], [place], [address] Phone: [phone]   Fax:  [phone]  Physical Therapy Treatment  Patient Details  Name: Laura Lawrence MRN: [id number] Date of Birth: 06-17-1955 Referring Provider:  Jackie Plum, MD  Encounter Date: 04/03/2014      PT End of Session - 04/03/14 1232    Visit Number 7   Number of Visits 9   Date for PT Re-Evaluation 05/10/14   Authorization Type BCBS   PT Start Time 1022   PT Stop Time 1100   PT Time Calculation (min) 38 min   Activity Tolerance Patient tolerated treatment well      Past Medical History  Diagnosis Date  . Rheumatoid arthritis   . Hypercholesterolemia   . Hypertension   . Multiple allergies     No past surgical history on file.  There were no vitals taken for this visit.  Visit Diagnosis:  Abnormality of gait  Abnormal posture      Subjective Assessment - 04/03/14 1023    Symptoms Incorporating the exercises into my day is taking a toll on my energy level.  Trying to pay attention to walking pattern, especially my L side with walking.   Currently in Pain? No/denies                    [facility] Adult PT Treatment/Exercise - 04/03/14 0001    Exercises   Exercises Ankle   Ankle Exercises: Stretches   Gastroc Stretch 30 seconds;2 reps  Runner's stretch position cues for heel on floor   Other Stretch Standing heelcord stretch on 2 inch block with UE support, 2 x 30 seconds; gastroc stretch off edge of 6 inch step x 10 seconds   Ankle Exercises: Seated   Toe Raise 10 reps;3 seconds  2 sets with resisted red theraband   Other Seated Ankle Exercises resisted L ankle eversion 2 sets x 10 reps, red theraband x 10, no resistance x 10 cues given to avoid hip external rotation compensation                PT Education - 04/03/14 1228    Education provided Yes   Education Details POC, functional issues, brief  review of community information, how to track exercises/use of chart to best fit exercises into daily routine; addition to HEP of ankle strength/stretch   Person(s) Educated Patient   Methods Explanation;Demonstration;Handout   Comprehension Verbalized understanding;Returned demonstration             PT Long Term Goals - 03/11/14 1717    PT LONG TERM GOAL #1   Title Pt will be independent with HEP to address transfers, gait and balance. (Target 04/11/14)   Baseline No current HEP   Time 4   Period Weeks   Status New   PT LONG TERM GOAL #2   Title perform at least 8 of 10 reps of sit<>stand transfers with minimal to no UE support, for improved transfer efficiency and safety.   Time 4   Period Weeks   Status New   PT LONG TERM GOAL #3   Title improve Functional Gait Assessment to at least 22/30 for decreased fall risk.   Time 4   Period Weeks   Status New   PT LONG TERM GOAL #4   Title verbalize/demonstrate understanding of techniques to reduce hastening/freezing episodes with gait for decreased risk of falls.   Time 4   Period Weeks  Status New   PT LONG TERM GOAL #5   Title verbalize plans for continued community fitness upon D/C from PT.   Time 4   Period Weeks   Status New               Plan - 04/03/14 1234    Clinical Impression Statement Trialed multiple positions for heel cord/gastroc stretch as well as strengthening of L ankle.  Pt has difficulty isolating L ankle eversion without compensations at L hip.   Pt will benefit from skilled therapeutic intervention in order to improve on the following deficits Abnormal gait;Decreased balance;Difficulty walking;Postural dysfunction   Rehab Potential Good   PT Frequency 2x / week   PT Duration 4 weeks   PT Treatment/Interventions ADLs/Self Care Home Management;Therapeutic activities;Functional mobility training;Stair training;Gait training;Therapeutic exercise;Neuromuscular re-education;Balance  training;Patient/family education   PT Next Visit Plan Review HEP; supine/prone PWR! simulated bed mobility, hills/slopes/compliant surfaces, and turns; check goals and further discuss renewal/discharge   Consulted and Agree with Plan of Care Patient        Problem List Patient Active Problem List   Diagnosis Date Noted  . Tremor 06/17/2013    Hazael Olveda W. 04/03/2014, 12:48 PM  Lonia Blood, PT 04/03/2014 12:49 PM Phone: [phone] Fax: [phone]   [facility] [address] [place], [place], [address] Phone: [phone]   Fax:  [phone]

## 2014-04-10 ENCOUNTER — Ambulatory Visit: Payer: BLUE CROSS/BLUE SHIELD | Admitting: Occupational Therapy

## 2014-04-10 ENCOUNTER — Encounter: Payer: Self-pay | Admitting: Occupational Therapy

## 2014-04-10 ENCOUNTER — Ambulatory Visit: Payer: BLUE CROSS/BLUE SHIELD | Admitting: Physical Therapy

## 2014-04-10 ENCOUNTER — Telehealth: Payer: Self-pay | Admitting: *Deleted

## 2014-04-10 ENCOUNTER — Other Ambulatory Visit: Payer: Self-pay | Admitting: *Deleted

## 2014-04-10 ENCOUNTER — Other Ambulatory Visit (HOSPITAL_COMMUNITY): Payer: Self-pay | Admitting: Internal Medicine

## 2014-04-10 DIAGNOSIS — Z7409 Other reduced mobility: Secondary | ICD-10-CM

## 2014-04-10 DIAGNOSIS — G20 Parkinson's disease: Secondary | ICD-10-CM

## 2014-04-10 DIAGNOSIS — R269 Unspecified abnormalities of gait and mobility: Secondary | ICD-10-CM

## 2014-04-10 DIAGNOSIS — R279 Unspecified lack of coordination: Secondary | ICD-10-CM

## 2014-04-10 NOTE — Therapy (Signed)
[place] [address] [place], [place], [address] Phone: [phone]   Fax:  [phone]  Physical Therapy Treatment  Patient Details  Name: Laura Lawrence MRN: [id number] Date of Birth: [birth year] Referring Provider:  Benito Mccreedy, MD  Encounter Date: 04/10/2014      PT End of Session - 04/10/14 1432    Visit Number 8   Number of Visits 9   Date for PT Re-Evaluation 05/10/14   Authorization Type [employer]   PT Start Time 1025   PT Stop Time 1104   PT Time Calculation (min) 39 min   Activity Tolerance Patient tolerated treatment well   Behavior During Therapy [facility] for tasks assessed/performed      Past Medical History  Diagnosis Date  . Rheumatoid arthritis   . Hypercholesterolemia   . Hypertension   . Multiple allergies     No past surgical history on file.  There were no vitals taken for this visit.  Visit Diagnosis:  Abnormality of gait        [facility] PT Assessment - 04/10/14 1036    Timed Up and Go Test   TUG --   Normal TUG (seconds) --   Manual TUG (seconds) --   Cognitive TUG (seconds) --   Functional Gait  Assessment   Gait assessed  Yes   Gait Level Surface Walks 20 ft in less than 5.5 sec, no assistive devices, good speed, no evidence for imbalance, normal gait pattern, deviates no more than 6 in outside of the 12 in walkway width.   Change in Gait Speed Able to smoothly change walking speed without loss of balance or gait deviation. Deviate no more than 6 in outside of the 12 in walkway width.   Gait with Horizontal Head Turns Performs head turns smoothly with slight change in gait velocity (eg, minor disruption to smooth gait path), deviates 6-10 in outside 12 in walkway width, or uses an assistive device.   Gait with Vertical Head Turns Performs head turns with no change in gait. Deviates no more than 6 in outside 12 in walkway width.   Gait and Pivot Turn Pivot turns safely within 3  sec and stops quickly with no loss of balance.   Step Over Obstacle Is able to step over 2 stacked shoe boxes taped together (9 in total height) without changing gait speed. No evidence of imbalance.   Gait with Narrow Base of Support Ambulates less than 4 steps heel to toe or cannot perform without assistance.   Gait with Eyes Closed Walks 20 ft, uses assistive device, slower speed, mild gait deviations, deviates 6-10 in outside 12 in walkway width. Ambulates 20 ft in less than 9 sec but greater than 7 sec.   Ambulating Backwards Walks 20 ft, uses assistive device, slower speed, mild gait deviations, deviates 6-10 in outside 12 in walkway width.   Steps Alternating feet, must use rail.   Total Score 23                  [facility] Adult PT Treatment/Exercise - 04/10/14 0001    Transfers   Transfers Sit to Stand;Stand to Sit   Sit to Stand 6: Modified independent (Device/Increase time);With upper extremity assist;From chair/3-in-1;Other/comment  x 10   Stand to Sit 6: Modified independent (Device/Increase time)   Timed Up and Go Test   TUG Normal TUG;Manual TUG;Cognitive TUG   Normal TUG (seconds) 7.41   Manual TUG (seconds) 7.75   Cognitive  TUG (seconds) 10.31      Bed mobility manipulating covers to allow pt to get in and out of bed.  Pt reports she currently tucks her sheets at bottom and On sides.  Practiced without sheet tucked on side and pt able to manipulate LE's in/out of bed and covers.               PT Long Term Goals - 04/10/14 1434    PT LONG TERM GOAL #1   Title Pt will be independent with HEP to address transfers, gait and balance. (Target 04/11/14)   Baseline No current HEP   Time 4   PT LONG TERM GOAL #2   Title perform at least 8 of 10 reps of sit<>stand transfers with minimal to no UE support, for improved transfer efficiency and safety.   Time 4   Period Weeks   Status Partially Met   PT LONG TERM GOAL #3   Title improve Functional Gait  Assessment to at least 22/30 for decreased fall risk.   Time 4   Period Weeks   Status Achieved   PT LONG TERM GOAL #4   Title verbalize/demonstrate understanding of techniques to reduce hastening/freezing episodes with gait for decreased risk of falls.   Time 4   Period Weeks   PT LONG TERM GOAL #5   Title verbalize plans for continued community fitness upon D/C from PT.   Time 4   Period Weeks               Plan - 04/10/14 1433    Clinical Impression Statement Pt met goal 3.  Goal 2 partially met as pt needs UE assist secondary to arthritis in knees.  Goal 1,4 and 5 ongoing.  Pt reports continued decrease in balance and fall over the weekend.   Pt will benefit from skilled therapeutic intervention in order to improve on the following deficits Abnormal gait;Decreased balance;Difficulty walking;Postural dysfunction   Rehab Potential Good   PT Frequency 2x / week   PT Duration 4 weeks   PT Treatment/Interventions ADLs/Self Care Home Management;Therapeutic activities;Functional mobility training;Stair training;Gait training;Therapeutic exercise;Neuromuscular re-education;Balance training;Patient/family education   PT Next Visit Plan Renewal per [name], PT.     Consulted and Agree with Plan of Care Patient        Problem List Patient Active Problem List   Diagnosis Date Noted  . Tremor 06/17/2013    Narda Bonds 04/10/2014, 2:37 PM  [place] [address] [place], [place], [address] Phone: [phone]   Fax:  [place], [place] 04/10/2014 2:37 PM Phone: [phone] Fax: [phone]

## 2014-04-10 NOTE — Telephone Encounter (Signed)
Order was placed for a Modified Barium Swallow, called [facility] and got appt scheduled with them. Called the pt and informed her about the test and the appt date and time. Thursday March 3 at 1130 am and to arrive 15 minutes early. Pt stated an understanding and a thanks

## 2014-04-10 NOTE — Therapy (Signed)
[facility] [address] [place], [place], [address] Phone: [phone]   Fax:  [phone]  Occupational Therapy Evaluation  Patient Details  Name: Laura Lawrence MRN: [id number] Date of Birth: Nov 19, 1961 Referring Provider:  Jackie Plum, MD  Encounter Date: 04/10/2014      OT End of Session - 04/10/14 1145    Visit Number 1   Number of Visits 16   Date for OT Re-Evaluation 05/08/14   OT Start Time 0845   OT Stop Time 0930   OT Time Calculation (min) 45 min   Activity Tolerance Patient tolerated treatment well      Past Medical History  Diagnosis Date  . Rheumatoid arthritis   . Hypercholesterolemia   . Hypertension   . Multiple allergies     History reviewed. No pertinent past surgical history.  There were no vitals taken for this visit.  Visit Diagnosis:  Impaired functional mobility and activity tolerance - Plan: Ot plan of care cert/re-cert  Lack of coordination - Plan: Ot plan of care cert/re-cert      Subjective Assessment - 04/10/14 0849    Symptoms I took a fall this week and feel more shaky this week.   Pertinent History see epic snapshot   Currently in Pain? Yes   Pain Score 3    Pain Location Elbow   Pain Orientation Right;Left   Pain Descriptors / Indicators Aching   Pain Type Chronic pain   Pain Onset More than a month ago   Pain Frequency Intermittent   Pain Relieving Factors I am due for a shot and they help.  Aleve, rest, heat sometimes.          [facility] OT Assessment - 04/10/14 0852    Assessment   Diagnosis Parkinson's disease   Onset Date 07/16/13   Prior Therapy currently PT   Precautions   Precautions None   Restrictions   Weight Bearing Restrictions No   Balance Screen   Has the patient fallen in the past 6 months Yes   How many times? 8-10 times in past 6 months   Has the patient had a decrease in activity level because of a fear of falling?  Yes   Is the  patient reluctant to leave their home because of a fear of falling?  Yes   Home  Environment   Family/patient expects to be discharged to: Private residence   Living Arrangements Spouse/significant other   Type of Home House   Home Access Stairs  R rail as you ascend   Home Layout Two level   Bathroom Shower/Tub Tub/Shower unit   Additional Comments Pt has portable seat that she used to use but husband doesn't like it.  Pt going to switch to using downstairs bathroom as hers and start using seat. Pt planning to move to [place] at end of year and will have handicapped accessible bathroom there.   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   ADL   Eating/Feeding Modified independent  cuttting difficult and some swallowing difficulty   Grooming Modified independent  takes twice as long - slow movements for control   Upper Body Bathing Modified independent  husband does back, increased time   Lower Body Bathing Modified independent  increased time   Upper Body Dressing  Minimal assistance  occassional assist with bra   Lower Body Dressing Modified independent  changed how and takes longer   Toilet Tranfer Modified independent  dififcult/increased time/urinary incontinence   Tub/Shower Transfer Modified independent   ADL comments Pt prefers to have husband in the house when she showers. She has not fallen yet getting in or out.   IADL   Shopping Needs to be accompanied on any shopping trip   Light Housekeeping Performs light daily tasks such as dishwashing, bed making  husband does heavier stuff and assists more now   Meal Prep Plans, prepares and serves adequate meals independently  using take out or going out more.   Community Mobility Drives own vehicle  not at night and husband  does most of it now   Medication Management Is responsible for taking medication in correct dosages at correct time  arthritis assisted bottles   Financial Management --  N/A husband does and has for past 8 years.   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 To be further assessed   Activity Tolerance   Activity Tolerance Tolerate 30+ min activity without fatigue  needs a rest every hour   Cognition   Overall Cognitive Status Impaired/Different from baseline  reports memory- dates/details.     Attention --  appears intact during eval   Memory Impaired  per pt report - will further assess via functional tasks   Observation/Other Assessments   Standing Functional Reach Test L/R= 5 inches   Outcome Measures Physical performance test #2= 14.98  Physical Performance test #4 = 29.48   Physical Performance Test   Yes   Simulated Eating Time (seconds) R=14.98   Donning Doffing Jacket Time (seconds) 29.48   Coordination   Gross Motor Movements are Fluid and Coordinated Yes   Fine Motor Movements are Fluid and Coordinated No   Box and Blocks R=40  L=42   Tremors None evident during eval however pt reports she has them intermittently.   Other 9 hole peg R= 27.03  L= 25.06   Tone   Assessment Location Right Upper Extremity;Left Upper Extremity   ROM / Strength   AROM / PROM / Strength AROM   AROM   Overall AROM  Within functional limits for tasks performed   Hand Function   Right Hand Gross Grasp Functional   Right Hand Grip (lbs) 80 pounds   Left Hand Gross Grasp Functional   Left Hand Grip (lbs) 65 pounds   RUE Tone   RUE Tone Mild   very mild rigidity   LUE Tone   LUE Tone Within Functional Limits                         OT Short Term Goals - 04/10/14 1153    OT SHORT TERM GOAL #1   Title Pt will be mod I with HEP - 05/08/2014   Status New   OT SHORT TERM GOAL #2   Title Pt will demonstrate ability to complete 30 minute functional activity of moderate exertion without rest break.    Status  New   OT SHORT TERM GOAL #3   Title Pt will demonstrate ability to complete 30 minute activity of moderate exertion with no more than one LOB   Status New   OT SHORT TERM GOAL #4   Title Pt will reduce simulated eating task on physical performance test time by 2 seconds (baseline= 14.98)   Status New   OT SHORT TERM GOAL #5   Title Pt will reduce simluated dressing task on Physical Performance test by 5 seconds (baseline = 29.48)   Status New           OT Long Term Goals - 04/10/14 1207    OT LONG TERM GOAL #1   Title Pt will be mod I with upgraded HEP prn - 06/05/2014   Status New   OT LONG TERM GOAL #2   Title Pt will tolerate 40 minutes of ambulatory functional task of moderate exertion without rest break   Status New   OT LONG TERM GOAL #3   Title Pt will tolerate 40 miinutes of ambulatory functional task of moderate exertion without loss of balance.   Status New   OT LONG TERM GOAL #4   Title Pt will decrease simluated eating task on Physical Performance Test by 4 seconds (baseline = 14.98)   Status New   OT LONG TERM GOAL #5   Title Pt will decrease simulated dressing task on Physical Peformance test by 10 seconds (baseline = 29.48)   Status New   OT LONG TERM GOAL #6   Title Pt will decreased 9 hole peg test by 3 seconds combined (baseline = R= 27.03, L= 25.06)   Status New               Plan - 04/10/14 1146    Clinical Impression Statement Pt is a 59 year old right handed female diagnosed with Parkinson's disease in 07/2013. Pt has numerous falls over the past 6 months and has  noticed she is having more dificulty with fine motor tasks. Pt presents today with the following impairments that impact her ability to complete ADL's with ease, complete IADL's and impact work performance: impaired functional mobility and activity tolerance, decreased UE coordination (R greater than L), impared memory, mild rigidity RUE, intemittent tremors.   Rehab Potential Good   Clinical Impairments Affecting Rehab Potential Pt will benefit from skilled OT to address the above deficits.   OT Frequency 2x / week   OT Duration 8 weeks   OT Treatment/Interventions Self-care/ADL training;DME and/or AE instruction;Energy conservation;Neuromuscular education;Therapeutic exercise;Building services engineer;Therapeutic activities;Balance training;Patient/family education;Cognitive remediation/compensation   Plan initate HEP for coordination   Consulted and Agree with Plan of Care Patient        Problem List Patient Active Problem List   Diagnosis Date Noted  . Tremor 06/17/2013    Norton Pastel, OTR/L 04/10/2014, 12:14 PM  [place] [facility] [address] [place], [place], [address] Phone: [phone]   Fax:  [phone]

## 2014-04-15 ENCOUNTER — Ambulatory Visit: Payer: BLUE CROSS/BLUE SHIELD | Attending: Diagnostic Neuroimaging | Admitting: Physical Therapy

## 2014-04-15 ENCOUNTER — Ambulatory Visit: Payer: BLUE CROSS/BLUE SHIELD | Admitting: Occupational Therapy

## 2014-04-15 DIAGNOSIS — R279 Unspecified lack of coordination: Secondary | ICD-10-CM

## 2014-04-15 DIAGNOSIS — R293 Abnormal posture: Secondary | ICD-10-CM | POA: Diagnosis not present

## 2014-04-15 DIAGNOSIS — Z7409 Other reduced mobility: Secondary | ICD-10-CM

## 2014-04-15 DIAGNOSIS — R269 Unspecified abnormalities of gait and mobility: Secondary | ICD-10-CM | POA: Insufficient documentation

## 2014-04-15 NOTE — Therapy (Signed)
[facility] [address] [place], [place], [address] Phone: [phone]   Fax:  [phone]  Occupational Therapy Treatment  Patient Details  Name: Laura Lawrence MRN: [id number] Date of Birth: 1955/05/11 Referring Provider:  Jackie Plum, MD  Encounter Date: 04/15/2014      OT End of Session - 04/15/14 0900    Visit Number 2   Number of Visits 16   Date for OT Re-Evaluation 05/08/14   OT Start Time 0852   OT Stop Time 0930   OT Time Calculation (min) 38 min   Activity Tolerance Patient tolerated treatment well   Behavior During Therapy [facility] for tasks assessed/performed      Past Medical History  Diagnosis Date  . Rheumatoid arthritis   . Hypercholesterolemia   . Hypertension   . Multiple allergies     No past surgical history on file.  There were no vitals taken for this visit.  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 Yes   Pain Score 4    Pain Location Knee   Pain Orientation Left   Pain Descriptors / Indicators Aching   Pain Type Chronic pain   Pain Onset More than a month ago   Pain Frequency Intermittent   Aggravating Factors  standing   Pain Relieving Factors rest   Multiple Pain Sites No          Treatment: Pt was instructed in coordination HEP, she returned demonstration following min v.c./ demo. Pt was instructed in PWR hands and provided with handout. Arm bike for 5 mins, min v.c. for speed, Pt was able to maintain 31-38 RPM. Grooved pegboard for increased fine motor coordination, placing with RUE, removing with LUE, min v.c. to avoid leaning, and for big movements.                OT Education - 04/15/14  0909    Education provided Yes   Education Details PWR hands, coordination HEP   Person(s) Educated Patient   Methods Explanation;Demonstration;Handout;Verbal cues   Comprehension Verbalized understanding;Returned demonstration          OT Short Term Goals - 04/10/14 1153    OT SHORT TERM GOAL #1   Title Pt will be mod I with HEP - 05/08/2014   Status New   OT SHORT TERM GOAL #2   Title Pt will demonstrate ability to complete 30 minute functional activity of moderate exertion without rest break.    Status New   OT SHORT TERM GOAL #3   Title Pt will demonstrate ability to complete 30 minute activity of moderate exertion with no more than one LOB   Status New   OT SHORT TERM GOAL #4   Title Pt will reduce simulated eating task on physical performance test time by 2 seconds (baseline= 14.98)   Status New   OT SHORT TERM GOAL #5   Title Pt will reduce simluated dressing task on Physical Performance test by 5 seconds (baseline = 29.48)   Status New           OT Long Term Goals - 04/10/14 1207    OT LONG TERM GOAL #1   Title Pt will be  mod I with upgraded HEP prn - 06/05/2014   Status New   OT LONG TERM GOAL #2   Title Pt will tolerate 40 minutes of ambulatory functional task of moderate exertion without rest break   Status New   OT LONG TERM GOAL #3   Title Pt will tolerate 40 miinutes of ambulatory functional task of moderate exertion without loss of balance.   Status New   OT LONG TERM GOAL #4   Title Pt will decrease simluated eating task on Physical Performance Test by 4 seconds (baseline = 14.98)   Status New   OT LONG TERM GOAL #5   Title Pt will decrease simulated dressing task on Physical Peformance test by 10 seconds (baseline = 29.48)   Status New   OT LONG TERM GOAL #6   Title Pt will decreased 9 hole peg test by 3 seconds combined (baseline = R= 27.03, L= 25.06)   Status New               Plan - 04/15/14 0957    Clinical Impression Statement Pt is  progressing towards goals. she demonstrates understanding of PWR hands/ and coordination HEP yet she can benefit from reinforcement.   Rehab Potential Good   Clinical Impairments Affecting Rehab Potential decreased strength, decreased coordination, decreased endurance,bradykinesia   OT Frequency 2x / week   OT Duration 8 weeks   Plan reinforce coordination HEP, adapted strategies for ADLS(cutting food)   Consulted and Agree with Plan of Care Patient        Problem List Patient Active Problem List   Diagnosis Date Noted  . Tremor 06/17/2013    Madelina Sanda 04/15/2014, 10:03 AM Keene Breath, OTR/L [phone] Phone: [phone] 10:03 AM 04/15/2014 [facility] [address] [place], [place], [address] Phone: [phone]   Fax:  [phone]

## 2014-04-15 NOTE — Patient Instructions (Signed)
  Coordination Activities  Perform the following activities for 20  Minutes 1-2 times per day with both hand(s).   Rotate ball in fingertips (clockwise and counter-clockwise).  Toss ball between hands.  Toss ball in air and catch with the same hand.  Flip cards 1 at a time as fast as you can.  Deal cards with your thumb (Hold deck in hand and push card off top with thumb).  Rotate card in hand (clockwise and counter-clockwise).  Pick up coins, buttons, marbles, dried beans/pasta of different sizes and place in container.  Pick up coins and place in container or coin bank.  Pick up coins and stack.  Pick up coins one at a time until you get 5-10 in your hand, then move coins from palm to fingertips to stack one at a time.   Focus on using big movements/ effort with each exercise

## 2014-04-16 NOTE — Patient Instructions (Signed)
Provided patient written instructions for : Heel/toes raises x 10 Wall bumps x 10 In corner on pillow with chair for support:  Feet apart with head turns x 10, head nods x 10, eyes closed x 10 seconds  Perform once-twice daily

## 2014-04-16 NOTE — Therapy (Signed)
[facility] [address] [place], [place], [address] Phone: [phone]   Fax:  [phone]  Physical Therapy Treatment  Patient Details  Name: Laura Lawrence MRN: [id number] Date of Birth: [birth year] Referring Provider:  Jackie Plum, MD  Encounter Date: 04/15/2014      PT End of Session - 04/16/14 0746    Visit Number 9   Number of Visits 9   Date for PT Re-Evaluation 05/10/14   Authorization Type BCBS   PT Start Time 1019   PT Stop Time 1103   PT Time Calculation (min) 44 min   Equipment Utilized During Treatment Gait belt   Activity Tolerance Patient tolerated treatment well      Past Medical History  Diagnosis Date  . Rheumatoid arthritis   . Hypercholesterolemia   . Hypertension   . Multiple allergies     No past surgical history on file.  There were no vitals taken for this visit.  Visit Diagnosis:  Abnormality of gait      Subjective Assessment - 04/15/14 1021    Symptoms No falls, no changes since last visit   Currently in Pain? Yes   Pain Score 2    Pain Location Knee   Pain Orientation Left   Pain Descriptors / Indicators Aching   Pain Type Chronic pain   Pain Frequency Intermittent   Aggravating Factors  staying in one position too long; steps   Pain Relieving Factors movement; [facility] Adult PT Treatment/Exercise - 04/15/14 1052    Standardized Balance Assessment   Standardized Balance Assessment Balance Master Testing  SOT 56/100; decr. vestibular, vision, somatosens use    High Level Balance   High Level Balance Activities Other (comment)  Corner balance exercises-chair, on pillow   High Level Balance Comments Hip/ankle strategy 10 reps each, then feet apart with head turns x 10, head nods x 10, eyes closed 10 seconds, 2 reps     Self Care:  Discussed results of Sensory Organization test, how results are pertinent to daily activities  and how we will address with exercise.           PT Education - 04/16/14 0745    Education provided Yes   Education Details HEP   Person(s) Educated Patient   Methods Explanation;Demonstration;Handout   Comprehension Verbalized understanding;Returned demonstration             PT Long Term Goals - 04/16/14 0749    PT LONG TERM GOAL #1   Title Pt will be independent with progressive HEP to address transfers, gait and dynamic balance. (Updated 04/15/14>Target 05/16/14)   Baseline need to add corner balance exercises   Time 4   Period Weeks   Status New   PT LONG TERM GOAL #2   Title improve Sensory Organization test composite score by at least 10% for improved overall balance. (Target 05/16/14)   Baseline SOT score 56/100   Time 4   Period Weeks   Status New   PT LONG TERM GOAL #3   Title improve Functional Gait Assessment to at least 22/30 for decreased fall risk. (Target 05/16/14)   Time 4   Period Weeks   Status On-going   PT LONG TERM GOAL #4   Title verbalize/demonstrate understanding of techniques to reduce hastening/freezing episodes with gait for decreased risk of falls. (Target 05/16/14)   Time 4  Period Weeks   Status On-going   PT LONG TERM GOAL #5   Title verbalize plans for continued community fitness upon D/C from PT. (Target 05/16/14)   Time 4   Period Weeks   Status On-going               Plan - 04/16/14 0746    Clinical Impression Statement Pt demonstrates decreased vestibular system use for balance, which may be contributing to pt's balance problems on unlevel surfaces and busy environments.  Pt would benefit from additional PT services to further address dynamic balance and gait activities.  See renewal   Pt will benefit from skilled therapeutic intervention in order to improve on the following deficits Abnormal gait;Decreased balance;Difficulty walking;Postural dysfunction   Rehab Potential Good   PT Frequency 2x / week   PT Duration 4 weeks    PT Treatment/Interventions ADLs/Self Care Home Management;Therapeutic activities;Functional mobility training;Stair training;Gait training;Therapeutic exercise;Neuromuscular re-education;Balance training;Patient/family education   PT Next Visit Plan Renew today; review corner balance exercises and progress compliant surface activities   Consulted and Agree with Plan of Care Patient    Note updated goals and additional recommendation of 2x/wks for 4 weeks added to plan of care.    Problem List Patient Active Problem List   Diagnosis Date Noted  . Tremor 06/17/2013    Dauntae Derusha W. 04/16/2014, 7:52 AM  Lonia Blood, PT 04/16/2014 7:54 AM Phone: [phone] Fax: [phone]   [facility] Outpt Rehabilitation [facility] [address] [place], [place], [address] Phone: [phone]   Fax:  [phone]

## 2014-04-17 ENCOUNTER — Ambulatory Visit (HOSPITAL_COMMUNITY)
Admission: RE | Admit: 2014-04-17 | Discharge: 2014-04-17 | Disposition: A | Discharge status: Home / Self Care | Payer: BLUE CROSS/BLUE SHIELD | Source: Ambulatory Visit | Attending: Internal Medicine | Admitting: Internal Medicine

## 2014-04-17 DIAGNOSIS — R1310 Dysphagia, unspecified: Secondary | ICD-10-CM | POA: Insufficient documentation

## 2014-04-17 DIAGNOSIS — M069 Rheumatoid arthritis, unspecified: Secondary | ICD-10-CM | POA: Insufficient documentation

## 2014-04-17 DIAGNOSIS — G20 Parkinson's disease: Secondary | ICD-10-CM

## 2014-04-17 DIAGNOSIS — I10 Essential (primary) hypertension: Secondary | ICD-10-CM | POA: Insufficient documentation

## 2014-04-17 DIAGNOSIS — E780 Pure hypercholesterolemia: Secondary | ICD-10-CM | POA: Diagnosis not present

## 2014-04-17 NOTE — Progress Notes (Signed)
MBSS complete. Full report located under chart review in imaging section.  Trig Mcbryar MA, CCC-SLP [phone]   

## 2014-04-30 ENCOUNTER — Ambulatory Visit: Payer: BLUE CROSS/BLUE SHIELD | Admitting: Physical Therapy

## 2014-04-30 ENCOUNTER — Ambulatory Visit: Payer: BLUE CROSS/BLUE SHIELD | Admitting: Occupational Therapy

## 2014-04-30 DIAGNOSIS — R293 Abnormal posture: Secondary | ICD-10-CM

## 2014-04-30 DIAGNOSIS — R269 Unspecified abnormalities of gait and mobility: Secondary | ICD-10-CM

## 2014-04-30 DIAGNOSIS — Z7409 Other reduced mobility: Secondary | ICD-10-CM

## 2014-04-30 DIAGNOSIS — R279 Unspecified lack of coordination: Secondary | ICD-10-CM

## 2014-04-30 NOTE — Therapy (Signed)
[facility] [address] [place], [place], [address] Phone: [phone]   Fax:  [phone]  Occupational Therapy Treatment  Patient Details  Name: Laura Lawrence MRN: [id number] Date of Birth: [birth year] Referring Provider:  Jackie Plum, MD  Encounter Date: 04/30/2014      OT End of Session - 04/30/14 0946    Visit Number 3   Number of Visits 16   Date for OT Re-Evaluation 05/08/14   Authorization Type BCBS   OT Start Time 0933   OT Stop Time 1015   OT Time Calculation (min) 42 min   Activity Tolerance Patient tolerated treatment well      Past Medical History  Diagnosis Date  . Rheumatoid arthritis   . Hypercholesterolemia   . Hypertension   . Multiple allergies     No past surgical history on file.  There were no vitals filed for this visit.  Visit Diagnosis:  Impaired functional mobility and activity tolerance  Lack of coordination  Abnormal posture      Subjective Assessment - 04/30/14 0934    Symptoms Pt reports she took some aleeve this morning, she travelled 14 hrs on weekend   Pertinent History see epic snapshot   Currently in Pain? Yes   Pain Score 4    Pain Location Shoulder   Pain Orientation Right;Left   Pain Descriptors / Indicators Aching   Pain Type Chronic pain   Pain Onset More than a month ago   Aggravating Factors  riding in car   Pain Relieving Factors movement       Treatment: Education provided regarding adapated strategies for ADLS. Techniques for donning coat, rocker knife, foam grips, use of larger movements, and button hook.  Pt verbalized understanding.UBE x 6 mins level 1 for conditioning, min v.c. For speed, pt able to maintain 33-42 RPM.                [facility]) - 04/30/14 1006    PWR! exercises Moves in sitting   PWR! Up 20   PWR! Rock 20   PWR! Twist 20            OT Education - 04/30/14 [phone]    Education provided Yes   Education Details button hook, rocker knife adapted stategies for ADLs   Person(s) Educated Patient   Methods Explanation;Demonstration;Handout   Comprehension Verbalized understanding;Returned demonstration          OT Short Term Goals - 04/10/14 1153    OT SHORT TERM GOAL #1   Title Pt will be mod I with HEP - 05/08/2014   Status New   OT SHORT TERM GOAL #2   Title Pt will demonstrate ability to complete 30 minute functional activity of moderate exertion without rest break.    Status New   OT SHORT TERM GOAL #3   Title Pt will demonstrate ability to complete 30 minute activity of moderate exertion with no more than one LOB   Status New   OT SHORT TERM GOAL #4   Title Pt will reduce simulated eating task on physical performance test time by 2 seconds (baseline= 14.98)   Status New   OT SHORT TERM GOAL #5   Title Pt will reduce simluated dressing task on Physical Performance test by 5 seconds (baseline = 29.48)   Status New           OT Long Term Goals - 04/10/14 1207    OT LONG TERM GOAL #1  Title Pt will be mod I with upgraded HEP prn - 06/05/2014   Status New   OT LONG TERM GOAL #2   Title Pt will tolerate 40 minutes of ambulatory functional task of moderate exertion without rest break   Status New   OT LONG TERM GOAL #3   Title Pt will tolerate 40 miinutes of ambulatory functional task of moderate exertion without loss of balance.   Status New   OT LONG TERM GOAL #4   Title Pt will decrease simluated eating task on Physical Performance Test by 4 seconds (baseline = 14.98)   Status New   OT LONG TERM GOAL #5   Title Pt will decrease simulated dressing task on Physical Peformance test by 10 seconds (baseline = 29.48)   Status New   OT LONG TERM GOAL #6   Title Pt will decreased 9 hole peg test by 3 seconds combined (baseline = R= 27.03, L= 25.06)   Status New               Plan - 04/30/14 1010    Clinical Impression Statement Pt is progressing towards  goals. she verbalizes understanding of recommendations for ADL strategies.   Plan reinforce coordiantion HEP   OT Home Exercise Plan coordination   Consulted and Agree with Plan of Care Patient        Problem List Patient Active Problem List   Diagnosis Date Noted  . Tremor 06/17/2013    Pavan Bring 04/30/2014, 10:17 AM Keene Breath, OTR/L [phone] Phone: [phone] 10:17 AM 03/16/2016[facility] Out[facility] Rehabilitation [facility] [address] [place], [place], [address] Phone: [phone]   Fax:  [phone]

## 2014-04-30 NOTE — Therapy (Signed)
[facility] [address] [place], [place], [address] Phone: [phone]   Fax:  [phone]  Physical Therapy Treatment  Patient Details  Name: Laura Lawrence MRN: [id number] Date of Birth: [birth year] Referring Provider:  Jackie Plum, MD  Encounter Date: 04/30/2014      PT End of Session - 04/30/14 1257    Visit Number 10   Number of Visits 17  from recert completed at previous visit 04/16/14   Date for PT Re-Evaluation 06/15/14   Authorization Type BCBS   Authorization - Number of Visits 30  combined   PT Start Time 0850   PT Stop Time 0929   PT Time Calculation (min) 39 min   Equipment Utilized During Treatment Gait belt   Activity Tolerance Patient tolerated treatment well   Behavior During Therapy [facility] for tasks assessed/performed      Past Medical History  Diagnosis Date  . Rheumatoid arthritis   . Hypercholesterolemia   . Hypertension   . Multiple allergies     No past surgical history on file.  There were no vitals filed for this visit.  Visit Diagnosis:  Abnormality of gait  Impaired functional mobility and activity tolerance      Subjective Assessment - 04/30/14 0852    Symptoms Arthritis was flaring, so I was having a good bit of pain.  Flexibility and stamina has improved.  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   Currently in Pain? Yes   Pain Score 4    Pain Location Knee  shoulders   Pain Orientation Left;Right   Pain Descriptors / Indicators Aching   Pain Type Chronic pain   Aggravating Factors  staying in one position too long; arthritis flare-ups   Pain Relieving Factors movement, Aleve                       [facility] Adult PT Treatment/Exercise - 04/30/14 1256    High Level Balance   High Level Balance Activities Side stepping;Backward walking;Marching forwards;Marching backwards  3 reps x 10 ft each at counter on red compliant mat   High  Level Balance Comments on mat surface-semi-tandem stance with head turns and nods x 10 reps with supervision           [facility]) - 04/30/14 1006    PWR! exercises Moves in sitting   PWR! Up 20   PWR! Rock 20   PWR! Twist 20          Balance Exercises - 04/30/14 0856    Balance Exercises: Standing   Standing Eyes Opened Wide (BOA);Narrow base of support (BOS);Solid surface;Foam/compliant surface;5 reps  head turns, head nods,    Standing Eyes Closed Narrow base of support (BOS);Wide (BOA);Foam/compliant surface;Solid surface;1 rep;10 secs  each position   Wall Bumps Hip  10 reps for improved hip strategy with UE support   Heel Raises Limitations 10 reps with UE support  heel/toe raises   Other Standing Exercises On foam:  marching in place x 10, forward kicks x 10, forward step taps x 10, side step taps x 10, back step taps, then back step and weightshift x 10 reps each with UE support                PT Long Term Goals - 04/16/14 0749    PT LONG TERM GOAL #1   Title Pt will be independent with progressive HEP to address transfers, gait and  dynamic balance. (Updated 04/15/14>Target 05/16/14)   Baseline need to add corner balance exercises   Time 4   Period Weeks   Status New   PT LONG TERM GOAL #2   Title improve Sensory Organization test composite score by at least 10% for improved overall balance. (Target 05/16/14)   Baseline SOT score 56/100   Time 4   Period Weeks   Status New   PT LONG TERM GOAL #3   Title improve Functional Gait Assessment to at least 22/30 for decreased fall risk. (Target 05/16/14)   Time 4   Period Weeks   Status On-going   PT LONG TERM GOAL #4   Title verbalize/demonstrate understanding of techniques to reduce hastening/freezing episodes with gait for decreased risk of falls. (Target 05/16/14)   Time 4   Period Weeks   Status On-going   PT LONG TERM GOAL #5   Title verbalize plans for continued community fitness upon D/C from PT. (Target  05/16/14)   Time 4   Period Weeks   Status On-going               Plan - 04/30/14 1259    Clinical Impression Statement Pt has increased sway/increased difficulty with compliant mat surfaces in corner with head movements.  Pt will benefit from further skilled PT to further address dynamic balance and gait.   Pt will benefit from skilled therapeutic intervention in order to improve on the following deficits Abnormal gait;Decreased balance;Difficulty walking;Postural dysfunction   Rehab Potential Good   PT Frequency 2x / week   PT Duration 4 weeks  wk 2 of 4   PT Treatment/Interventions ADLs/Self Care Home Management;Therapeutic activities;Functional mobility training;Stair training;Gait training;Therapeutic exercise;Neuromuscular re-education;Balance training;Patient/family education   PT Next Visit Plan Corner balance exercises on compliant surfaces-add to HEP   Consulted and Agree with Plan of Care Patient        Problem List Patient Active Problem List   Diagnosis Date Noted  . Tremor 06/17/2013    Adan Baehr W. 04/30/2014, 1:05 PM  Lonia Blood, PT 04/30/2014 1:09 PM Phone: [phone] Fax: [phone]   [facility] Out[facility] [address] [place], [place], [address] Phone: [phone]   Fax:  [phone]

## 2014-05-01 ENCOUNTER — Ambulatory Visit: Payer: BLUE CROSS/BLUE SHIELD | Admitting: Occupational Therapy

## 2014-05-01 ENCOUNTER — Ambulatory Visit: Payer: BLUE CROSS/BLUE SHIELD | Admitting: Physical Therapy

## 2014-05-01 DIAGNOSIS — R269 Unspecified abnormalities of gait and mobility: Secondary | ICD-10-CM | POA: Diagnosis not present

## 2014-05-01 DIAGNOSIS — R279 Unspecified lack of coordination: Secondary | ICD-10-CM

## 2014-05-01 DIAGNOSIS — Z7409 Other reduced mobility: Secondary | ICD-10-CM

## 2014-05-01 NOTE — Therapy (Signed)
[facility] [address] [place], [place], [address] Phone: [phone]   Fax:  [phone]  Physical Therapy Treatment  Patient Details  Name: Laura Lawrence MRN: [id number] Date of Birth: 03-12-1955 Referring Provider:  Jackie Plum, MD  Encounter Date: 05/01/2014      PT End of Session - 05/01/14 1035    Visit Number 11   Number of Visits 17   Date for PT Re-Evaluation 06/15/14   Authorization Type BCBS   Authorization - Number of Visits 30  combined   PT Start Time 0933   PT Stop Time 1015   PT Time Calculation (min) 42 min   Equipment Utilized During Treatment Gait belt   Activity Tolerance Patient tolerated treatment well   Behavior During Therapy [facility] for tasks assessed/performed      Past Medical History  Diagnosis Date  . Rheumatoid arthritis   . Hypercholesterolemia   . Hypertension   . Multiple allergies     No past surgical history on file.  There were no vitals filed for this visit.  Visit Diagnosis:  Abnormality of gait  Impaired functional mobility and activity tolerance      Subjective Assessment - 05/01/14 0935    Symptoms Nothing new; I don't hurt this morning, so that's a good thing   Currently in Pain? No/denies                       [facility] Adult PT Treatment/Exercise - 05/01/14 1031    High Level Balance   High Level Balance Activities Side stepping;Backward walking;Turns  Forward walk, all on red/blue mats 3 reps x 25 ft min guard   High Level Balance Comments on mat surface:  forward kicks, forward step taps, side step taps, back step taps x 10 reps each with UE support; on solid surface-back step and weight shift x 10 reps as means for improved stepping strategy for balance; on mat surface-sidestepping with step taps and knocking over cones for improved single limb stance, with several episodes of posterior LOB, with therapist min assist             Balance Exercises - 05/01/14 0936    Balance Exercises: Standing   Standing Eyes Opened Wide (BOA);Narrow base of support (BOS);Foam/compliant surface;Solid surface;5 reps   Standing Eyes Closed Narrow base of support (BOS);Wide (BOA);Foam/compliant surface;Solid surface;1 rep;10 secs           PT Education - 05/01/14 1035    Education provided Yes   Education Details HEP additions-corner balance exercises on foam   Person(s) Educated Patient   Methods Explanation;Demonstration   Comprehension Verbalized understanding;Returned demonstration             PT Long Term Goals - 04/16/14 0749    PT LONG TERM GOAL #1   Title Pt will be independent with progressive HEP to address transfers, gait and dynamic balance. (Updated 04/15/14>Target 05/16/14)   Baseline need to add corner balance exercises   Time 4   Period Weeks   Status New   PT LONG TERM GOAL #2   Title improve Sensory Organization test composite score by at least 10% for improved overall balance. (Target 05/16/14)   Baseline SOT score 56/100   Time 4   Period Weeks   Status New   PT LONG TERM GOAL #3   Title improve Functional Gait Assessment to at least 22/30 for decreased fall risk. (Target 05/16/14)   Time 4  Period Weeks   Status On-going   PT LONG TERM GOAL #4   Title verbalize/demonstrate understanding of techniques to reduce hastening/freezing episodes with gait for decreased risk of falls. (Target 05/16/14)   Time 4   Period Weeks   Status On-going   PT LONG TERM GOAL #5   Title verbalize plans for continued community fitness upon D/C from PT. (Target 05/16/14)   Time 4   Period Weeks   Status On-going               Plan - 05/01/14 1036    Pt will benefit from skilled therapeutic intervention in order to improve on the following deficits Abnormal gait;Decreased balance;Difficulty walking;Postural dysfunction   Rehab Potential Good   PT Frequency 2x / week   PT Duration 4 weeks  wk 2 of 4   PT  Treatment/Interventions ADLs/Self Care Home Management;Therapeutic activities;Functional mobility training;Stair training;Gait training;Therapeutic exercise;Neuromuscular re-education;Balance training;Patient/family education   PT Next Visit Plan review corner balance HEP, posterior step and weightshifting, try walking poles?   Consulted and Agree with Plan of Care Patient        Problem List Patient Active Problem List   Diagnosis Date Noted  . Tremor 06/17/2013    Adelle Zachar W. 05/01/2014, 10:38 AM  Lonia Blood, PT 05/01/2014 10:39 AM Phone: [phone] Fax: [phone]   [facility] Outpt Rehabilitation [facility] [address] [place], [place], [address] Phone: [phone]   Fax:  [phone]

## 2014-05-01 NOTE — Patient Instructions (Signed)
Feet Apart (Compliant Surface) Varied Arm Positions - Eyes Open   With eyes open, standing on compliant surface: __pillow or towel______, feet shoulder width apart and arms out, look at a stationary object. Turn head side to side 5-10 times, Nod head up and down 5-10 times.  Copyright  VHI. All rights reserved.  Feet Together (Compliant Surface) Varied Arm Positions - Eyes Open   With eyes open, standing on compliant surface: ___pillow or towel_____, feet together and arms out, look at a stationary object. Head turns side to side x 10 reps, then head nods x 10 reps. Copyright  VHI. All rights reserved.  Feet Apart (Compliant Surface) Varied Arm Positions - Eyes Closed   Stand on compliant surface: _pillow or towel_______ with feet shoulder width apart and arms out. Close eyes and visualize upright position. Hold_10___ seconds. Repeat _3___ times per session.   Copyright  VHI. All rights reserved.  Feet Together (Compliant Surface) Varied Arm Positions - Eyes Closed   Stand on compliant surface: ___pillow/towel_____ with feet together and arms out. Close eyes and visualize upright position. Hold_10___ seconds. Repeat _3___ times per session.   Copyright  VHI. All rights reserved.

## 2014-05-01 NOTE — Therapy (Signed)
[facility] [address] [place], [place], [address] Phone: [phone]   Fax:  [phone]  Occupational Therapy Treatment  Patient Details  Name: Laura Lawrence MRN: [id number] Date of Birth: [birth year] Referring Provider:  Jackie Plum, MD  Encounter Date: 05/01/2014      OT End of Session - 05/01/14 0851    Visit Number 4   Number of Visits 16   Date for OT Re-Evaluation 05/08/14   Authorization Type BCBS   OT Start Time 0850   OT Stop Time 0930   OT Time Calculation (min) 40 min   Activity Tolerance Patient tolerated treatment well   Behavior During Therapy [facility] for tasks assessed/performed      Past Medical History  Diagnosis Date  . Rheumatoid arthritis   . Hypercholesterolemia   . Hypertension   . Multiple allergies     No past surgical history on file.  There were no vitals filed for this visit.  Visit Diagnosis:  Lack of coordination  Impaired functional mobility and activity tolerance      Subjective Assessment - 05/01/14 0851    Currently in Pain? No/denies                   OT Treatments/Exercises (OP) - 05/01/14 0001    Fine Motor Coordination   Fine Motor Coordination In hand manipuation training;Flipping cards;Dealing card with thumb;Picking up coins;Stacking coins   In Hand Manipulation Training manipulating coins to count out, push out of palm with thumb, bilaterally   Flipping cards with bilateral UE's, min v.c.   Dealing card with thumb bilateral UE's, min v.c.   Picking up coins bilateral UE's, min v.c.   Stacking coins bilateral UE's min v.c   O'Connor pegs Placing pegs with tweezers, RUE, min difficulty/ v.c.   Removing with LUE, min v.c for posture      Arm bike x 6 mins for conditioning, min v.c. For speed, pt was able to maintain 35-40 RPM.            OT Short Term Goals - 04/10/14 1153    OT SHORT TERM GOAL #1   Title Pt will be mod I  with HEP - 05/08/2014   Status New   OT SHORT TERM GOAL #2   Title Pt will demonstrate ability to complete 30 minute functional activity of moderate exertion without rest break.    Status New   OT SHORT TERM GOAL #3   Title Pt will demonstrate ability to complete 30 minute activity of moderate exertion with no more than one LOB   Status New   OT SHORT TERM GOAL #4   Title Pt will reduce simulated eating task on physical performance test time by 2 seconds (baseline= 14.98)   Status New   OT SHORT TERM GOAL #5   Title Pt will reduce simluated dressing task on Physical Performance test by 5 seconds (baseline = 29.48)   Status New           OT Long Term Goals - 04/10/14 1207    OT LONG TERM GOAL #1   Title Pt will be mod I with upgraded HEP prn - 06/05/2014   Status New   OT LONG TERM GOAL #2   Title Pt will tolerate 40 minutes of ambulatory functional task of moderate exertion without rest break   Status New   OT LONG TERM GOAL #3   Title Pt will tolerate 40 miinutes of ambulatory functional  task of moderate exertion without loss of balance.   Status New   OT LONG TERM GOAL #4   Title Pt will decrease simluated eating task on Physical Performance Test by 4 seconds (baseline = 14.98)   Status New   OT LONG TERM GOAL #5   Title Pt will decrease simulated dressing task on Physical Peformance test by 10 seconds (baseline = 29.48)   Status New   OT LONG TERM GOAL #6   Title Pt will decreased 9 hole peg test by 3 seconds combined (baseline = R= 27.03, L= 25.06)   Status New               Plan - 05/01/14 0925    Clinical Impression Statement Pt is progressing towards goals for increased fine motor coordination   Plan fine motor coordination, ADL strategies   OT Home Exercise Plan coordination   Consulted and Agree with Plan of Care Patient        Problem List Patient Active Problem List   Diagnosis Date Noted  . Tremor 06/17/2013    Braylon Lemmons 05/01/2014, 9:31  AM Keene Breath, OTR/L [phone] Phone: [phone] 9:31 AM 05/01/2014 [facility] [address] [place], [place], [address] Phone: [phone]   Fax:  [phone]

## 2014-05-06 ENCOUNTER — Encounter: Payer: Self-pay | Admitting: Physical Therapy

## 2014-05-06 ENCOUNTER — Ambulatory Visit: Payer: BLUE CROSS/BLUE SHIELD | Admitting: Physical Therapy

## 2014-05-06 ENCOUNTER — Ambulatory Visit: Payer: BLUE CROSS/BLUE SHIELD | Admitting: Occupational Therapy

## 2014-05-06 DIAGNOSIS — Z7409 Other reduced mobility: Secondary | ICD-10-CM

## 2014-05-06 DIAGNOSIS — R269 Unspecified abnormalities of gait and mobility: Secondary | ICD-10-CM | POA: Diagnosis not present

## 2014-05-06 DIAGNOSIS — R279 Unspecified lack of coordination: Secondary | ICD-10-CM

## 2014-05-06 NOTE — Patient Instructions (Signed)

## 2014-05-06 NOTE — Therapy (Signed)
[facility] [address] [place], [place], [address] Phone: [phone]   Fax:  [phone]  Occupational Therapy Treatment  Patient Details  Name: Laura Lawrence MRN: [id number] Date of Birth: 1956-01-21 Referring Provider:  Benito Mccreedy, MD  Encounter Date: 05/06/2014      OT End of Session - 05/06/14 1022    Visit Number 5   Number of Visits 16   Authorization Type BCBS   OT Start Time 1019   OT Stop Time 1100   OT Time Calculation (min) 41 min   Activity Tolerance Patient tolerated treatment well   Behavior During Therapy [facility] for tasks assessed/performed      Past Medical History  Diagnosis Date  . Rheumatoid arthritis   . Hypercholesterolemia   . Hypertension   . Multiple allergies     No past surgical history on file.  There were no vitals filed for this visit.  Visit Diagnosis:  Lack of coordination  Impaired functional mobility and activity tolerance      Subjective Assessment - 05/06/14 1020    Symptoms Pt reports she was hurting this morning, she took aleeve this a.m. and it helped   Pertinent History see epic snapshot   Currently in Pain? Yes   Pain Score 3    Pain Location --  generalized pain   Pain Descriptors / Indicators Aching   Pain Type Chronic pain   Pain Onset More than a month ago   Pain Frequency Intermittent   Aggravating Factors  standing for long periods   Pain Relieving Factors movement   Multiple Pain Sites No        Treatment:Therapist checked progress towards STG's. Education provided regarding PWR! Hands, 10 reps each exercise, min v.c. Education provided regarding ways to incorporate big movements into ADLS.Pt practiced opening containers using big movements/ effort. Reviewed coordination HEP for flipping cards/ dealing with thumb, rotating ball, min v.c. Then pt returned demonstration. Pt performed dynamic step and reach to toss scarves to target, min  v.c. For big movements/ finger extension.                    OT Education - 05/06/14 1036    Education provided Yes   Education Details PWR hands, ways to incorporate big movments into ADLs.   Person(s) Educated Patient   Methods Explanation;Demonstration;Handout   Comprehension Verbalized understanding;Returned demonstration          OT Short Term Goals - 05/06/14 1038    OT SHORT TERM GOAL #1   Title Pt will be mod I with HEP - 05/08/2014   Status Achieved   OT SHORT TERM GOAL #2   Title Pt will demonstrate ability to complete 30 minute functional activity of moderate exertion without rest break.    Baseline met per pt report, unloading car, and setting up at craft shows   Status Achieved   OT [place] #3   Title Pt will demonstrate ability to complete 30 minute activity of moderate exertion with no more than one LOB   Status On-going   OT SHORT TERM GOAL #4   Title Pt will reduce simulated eating task on physical performance test time by 2 seconds (baseline= 14.98)   Baseline 05/06/14- met 10.12 secs   Status Achieved   OT SHORT TERM GOAL #5   Title Pt will reduce simluated dressing task on Physical Performance test by 5 seconds (baseline = 29.48)   Baseline 05/06/14 met-  12.34 secs   Status Achieved           OT Long Term Goals - 04/10/14 1207    OT LONG TERM GOAL #1   Title Pt will be mod I with upgraded HEP prn - 06/05/2014   Status New   OT LONG TERM GOAL #2   Title Pt will tolerate 40 minutes of ambulatory functional task of moderate exertion without rest break   Status New   OT LONG TERM GOAL #3   Title Pt will tolerate 40 miinutes of ambulatory functional task of moderate exertion without loss of balance.   Status New   OT LONG TERM GOAL #4   Title Pt will decrease simluated eating task on Physical Performance Test by 4 seconds (baseline = 14.98)   Status New   OT LONG TERM GOAL #5   Title Pt will decrease simulated dressing task on  Physical Peformance test by 10 seconds (baseline = 29.48)   Status New   OT LONG TERM GOAL #6   Title Pt will decreased 9 hole peg test by 3 seconds combined (baseline = R= 27.03, L= 25.06)   Status New               Plan - 05/06/14 1234    Clinical Impression Statement Pt is progressing towards goals 4/5 short term goals achieved.   Clinical Impairments Affecting Rehab Potential decreased strength, decreased coordination, decreased endurance,bradykinesia   Plan coordiantion, ADL strategies/ big movments   OT Home Exercise Plan coordination   Consulted and Agree with Plan of Care Patient        Problem List Patient Active Problem List   Diagnosis Date Noted  . Tremor 06/17/2013    RINE,KATHRYN 05/06/2014, 12:44 PM [name], OTR/L [phone] Phone: [phone] 12:44 PM 05/06/2014 [facility] [address] [place], [place], [address] Phone: [phone]   Fax:  [phone]

## 2014-05-06 NOTE — Patient Instructions (Signed)
Quad Set   With other leg bent, foot flat, slowly tighten muscles on thigh of straight leg while counting out loud to 5. Repeat with other leg. Repeat 10 times. Do 2 sessions per day.  http://gt2.exer.us/276   Copyright  VHI. All rights reserved.   KNEE: Extension, Long Arc Quads - Sitting   Raise leg until knee is straight.  Hold and count to 5. 10 reps per set, 2 sets per day.  Repeat on other leg.  Copyright  VHI. All rights reserved.   Knee Extension: Short Arc (Eccentric) - Supine or Sitting   Lie on back with roll under knee. Extend knee. Slowly lower foot for 3-5 seconds. 10 reps per set, 2 sets per day. Repeat on other leg.  Copyright  VHI. All rights reserved.

## 2014-05-07 ENCOUNTER — Ambulatory Visit: Payer: BLUE CROSS/BLUE SHIELD | Admitting: Physical Therapy

## 2014-05-07 ENCOUNTER — Encounter: Payer: Self-pay | Admitting: Physical Therapy

## 2014-05-07 ENCOUNTER — Ambulatory Visit: Payer: BLUE CROSS/BLUE SHIELD | Admitting: Occupational Therapy

## 2014-05-07 DIAGNOSIS — Z7409 Other reduced mobility: Secondary | ICD-10-CM

## 2014-05-07 DIAGNOSIS — R279 Unspecified lack of coordination: Secondary | ICD-10-CM

## 2014-05-07 DIAGNOSIS — R269 Unspecified abnormalities of gait and mobility: Secondary | ICD-10-CM

## 2014-05-07 NOTE — Therapy (Signed)
[facility] [address] [place], [place], [address] Phone: [phone]   Fax:  [phone]  Physical Therapy Treatment  Patient Details  Name: Laura Lawrence MRN: [id number] Date of Birth: 10-02-1955 Referring Provider:  Jackie Plum, MD  Encounter Date: 05/06/2014      PT End of Session - 05/07/14 1304    Visit Number 12   Date for PT Re-Evaluation 06/15/14   Authorization Type BCBS   Authorization - Number of Visits 30   PT Start Time 0933   PT Stop Time 1016   PT Time Calculation (min) 43 min   Equipment Utilized During Treatment Gait belt   Activity Tolerance Patient tolerated treatment well   Behavior During Therapy [facility] for tasks assessed/performed      Past Medical History  Diagnosis Date  . Rheumatoid arthritis   . Hypercholesterolemia   . Hypertension   . Multiple allergies     History reviewed. No pertinent past surgical history.  There were no vitals filed for this visit.  Visit Diagnosis:  Impaired functional mobility and activity tolerance      Subjective Assessment - 05/06/14 0939    Symptoms Hasnt been able to walk as much with work schedule.  "Sore" from RA?  No falls.   Currently in Pain? Yes   Pain Score 6    Pain Location Other (Comment)  all joints   Pain Orientation Right;Left   Pain Descriptors / Indicators Aching   Pain Type Chronic pain   Pain Onset More than a month ago   Multiple Pain Sites No                       [facility] Adult PT Treatment/Exercise - 05/07/14 1256    Knee/Hip Exercises: Aerobic   Stationary Bike Nustep level 4 all 4 extremities x 8 minutes   Knee/Hip Exercises: Seated   Long Arc Quad Right;10 reps   Knee/Hip Exercises: Supine   Quad Sets Both;10 reps;2 sets   Short Arc Quad Sets Right;10 reps   Other Supine Knee Exercises Negative Lachmans and Anterior drawer test for R ACL            [facility]) - 05/07/14 1300     Step and Reach Forward Other reps (comment)  20 reps   Step and Reach Backward Other reps (comment)  20 reps   Other Tasks 1 step forward step to backward step-used counter for UE support.         Balance Exercises - 05/07/14 1259    Balance Exercises: Standing   Standing Eyes Opened Wide (BOA);Narrow base of support (BOS);Foam/compliant surface;Solid surface;Other reps (comment)  10 reps   Standing Eyes Closed Narrow base of support (BOS);Wide (BOA);Foam/compliant surface;Solid surface;10 secs;2 reps           PT Education - 05/07/14 1304    Education provided Yes   Education Details HEP, walking on ground vs treadmill if treadmill increases R knee discomfort, recommendation to follow up with orthopedic MD on R knee discomfort   Person(s) Educated Patient   Methods Explanation;Demonstration;Handout   Comprehension Verbalized understanding             PT Long Term Goals - 04/16/14 0749    PT LONG TERM GOAL #1   Title Pt will be independent with progressive HEP to address transfers, gait and dynamic balance. (Updated 04/15/14>Target 05/16/14)   Baseline need to add corner balance exercises   Time  4   Period Weeks   Status New   PT LONG TERM GOAL #2   Title improve Sensory Organization test composite score by at least 10% for improved overall balance. (Target 05/16/14)   Baseline SOT score 56/100   Time 4   Period Weeks   Status New   PT LONG TERM GOAL #3   Title improve Functional Gait Assessment to at least 22/30 for decreased fall risk. (Target 05/16/14)   Time 4   Period Weeks   Status On-going   PT LONG TERM GOAL #4   Title verbalize/demonstrate understanding of techniques to reduce hastening/freezing episodes with gait for decreased risk of falls. (Target 05/16/14)   Time 4   Period Weeks   Status On-going   PT LONG TERM GOAL #5   Title verbalize plans for continued community fitness upon D/C from PT. (Target 05/16/14)   Time 4   Period Weeks   Status On-going                Plan - 05/07/14 1306    Clinical Impression Statement Pt reports feeling R knee unstable and feels like her L knee did when had ACL injury.  Encouraged pt to follow up with orthopedic MD on this issue.  Pt does have decreased stability with weight shift/stepping with SLS on R LE.  Continue PT per POC>   Pt will benefit from skilled therapeutic intervention in order to improve on the following deficits Abnormal gait;Decreased balance;Difficulty walking;Postural dysfunction   Rehab Potential Good   PT Frequency 2x / week   PT Duration 4 weeks   PT Treatment/Interventions ADLs/Self Care Home Management;Therapeutic activities;Functional mobility training;Stair training;Gait training;Therapeutic exercise;Neuromuscular re-education;Balance training;Patient/family education   PT Next Visit Plan Review HEP, gait with poles        Problem List Patient Active Problem List   Diagnosis Date Noted  . Tremor 06/17/2013    Newell Coral 05/07/2014, 1:16 PM  [facility] [address] [place], [place], [address] Phone: [phone]   Fax:  [phone]     Newell Coral, [place] [facility] 05/07/2014 1:16 PM Phone: [phone] Fax: [phone]

## 2014-05-07 NOTE — Therapy (Signed)
[facility] [address] [place], [place], [address] Phone: [phone]   Fax:  [phone]  Physical Therapy Treatment  Patient Details  Name: Laura Lawrence MRN: [id number] Date of Birth: 07/17/1955 Referring Provider:  Jackie Plum, MD  Encounter Date: 05/07/2014      PT End of Session - 05/07/14 2234    Visit Number 13   Number of Visits 17   Date for PT Re-Evaluation 06/15/14   Authorization Type BCBS   Authorization - Number of Visits 30   PT Start Time [phone]   PT Stop Time 1018   PT Time Calculation (min) 44 min   Activity Tolerance Patient tolerated treatment well   Behavior During Therapy [facility] for tasks assessed/performed      Past Medical History  Diagnosis Date  . Rheumatoid arthritis   . Hypercholesterolemia   . Hypertension   . Multiple allergies     History reviewed. No pertinent past surgical history.  There were no vitals filed for this visit.  Visit Diagnosis:  Abnormality of gait      Subjective Assessment - 05/07/14 2225    Symptoms Denies changes or falls since last visit.  Sore all over.   Pertinent History Parkinson's disease diagnosed at least 6 months ago   Patient Stated Goals Pt's goals for therapy are to increase confidence with independence with gait and with stairs.   Currently in Pain? No/denies                       [facility] Adult PT Treatment/Exercise - 05/07/14 2227    Transfers   Transfers Sit to Stand;Stand to Sit   Sit to Stand 7: Independent   Stand to Sit 7: Independent   Ambulation/Gait   Ambulation/Gait Yes   Ambulation/Gait Assistance 5: Supervision   Ambulation/Gait Assistance Details gait with walking poles working on arm swing/pole placement, sequence, gait speed and heel strike.  PTA held end of poles and assisted pt with arm swing during portion of gait.   Ambulation Distance (Feet) 1000 Feet  greater than 1000 ft   Assistive device  Other (Comment)  walking poles   Gait Pattern Step-through pattern;Decreased dorsiflexion - left;Decreased trunk rotation  decreased arm swing   Ambulation Surface Level;Unlevel;Indoor;Outdoor   High Level Balance   High Level Balance Activities --   High Level Balance Comments --   Knee/Hip Exercises: Seated   Long Arc Quad Right;10 reps   Knee/Hip Exercises: Supine   Quad Sets Both;10 reps;2 sets   Short Arc [employer] Right;10 reps             PT Education - 05/07/14 2233    Education provided Yes   Education Details gait with walking poles, Upcoming PD community events   Person(s) Educated Patient   Methods Explanation;Demonstration;Handout   Comprehension Verbalized understanding             PT Long Term Goals - 04/16/14 0749    PT LONG TERM GOAL #1   Title Pt will be independent with progressive HEP to address transfers, gait and dynamic balance. (Updated 04/15/14>Target 05/16/14)   Baseline need to add corner balance exercises   Time 4   Period Weeks   Status New   PT LONG TERM GOAL #2   Title improve Sensory Organization test composite score by at least 10% for improved overall balance. (Target 05/16/14)   Baseline SOT score 56/100   Time 4  Period Weeks   Status New   PT LONG TERM GOAL #3   Title improve Functional Gait Assessment to at least 22/30 for decreased fall risk. (Target 05/16/14)   Time 4   Period Weeks   Status On-going   PT LONG TERM GOAL #4   Title verbalize/demonstrate understanding of techniques to reduce hastening/freezing episodes with gait for decreased risk of falls. (Target 05/16/14)   Time 4   Period Weeks   Status On-going   PT LONG TERM GOAL #5   Title verbalize plans for continued community fitness upon D/C from PT. (Target 05/16/14)   Time 4   Period Weeks   Status On-going               Plan - 05/07/14 2235    Clinical Impression Statement Pt's arm swing and cadence improved by end of gait session.  Reporting decreased  R knee pain with exercises.  Continue PT per POC.   Pt will benefit from skilled therapeutic intervention in order to improve on the following deficits Abnormal gait;Decreased balance;Difficulty walking;Postural dysfunction   Rehab Potential Good   PT Frequency 2x / week   PT Duration 4 weeks   PT Treatment/Interventions ADLs/Self Care Home Management;Therapeutic activities;Functional mobility training;Stair training;Gait training;Therapeutic exercise;Neuromuscular re-education;Balance training;Patient/family education   PT Next Visit Plan Gait with large amplitude arm swing, balance activities, compliant surfaces   Consulted and Agree with Plan of Care Patient        Problem List Patient Active Problem List   Diagnosis Date Noted  . Tremor 06/17/2013    Newell Coral 05/07/2014, 10:38 PM  [place] [facility] [address] [place], [place], [address] Phone: [phone]   Fax:  [phone]     Newell Coral, [place] [facility] 05/07/2014 10:38 PM Phone: [phone] Fax: [phone]

## 2014-05-07 NOTE — Therapy (Signed)
[facility] [address] [place], [place], [address] Phone: [phone]   Fax:  [phone]  Occupational Therapy Treatment  Patient Details  Name: Laura Lawrence MRN: [id number] Date of Birth: [birth year] Referring Provider:  Benito Mccreedy, MD  Encounter Date: 05/07/2014      OT End of Session - 05/07/14 0851    Visit Number 6   Number of Visits 16   Date for OT Re-Evaluation 05/08/14   Authorization Type BCBS   OT Start Time 0847   OT Stop Time 0930   OT Time Calculation (min) 43 min   Activity Tolerance Patient tolerated treatment well   Behavior During Therapy [facility] for tasks assessed/performed      Past Medical History  Diagnosis Date  . Rheumatoid arthritis   . Hypercholesterolemia   . Hypertension   . Multiple allergies     No past surgical history on file.  There were no vitals filed for this visit.  Visit Diagnosis:  Lack of coordination  Impaired functional mobility and activity tolerance      Subjective Assessment - 05/07/14 0850    Pertinent History see epic snapshot   Currently in Pain? Yes   Pain Score 6    Pain Location Hand  generalized joint pain   Pain Orientation Right;Left   Pain Descriptors / Indicators Aching   Pain Type Chronic pain   Pain Onset More than a month ago   Pain Frequency Intermittent   Aggravating Factors  standing   Pain Relieving Factors movement   Multiple Pain Sites No          Treatment: Armbike x 6 mins for conditioning, min v.c. For speed, pt was able to maintain 30-36 RPM. Paraffin applied to bilateral hands and wrists x 10 mins  due to pt reports of pain from arthritis, therapist provided  Education regarding ways to prevent future complications/ importance of exercise  while in paraffin treatment. Pt verbalized understanding of education. Pt reported decreased pain following paraffin. Dynamic step and reach to toss scarves to target with  bilateral UE's, min v.c. For large amplitude movements. Dual tasking while ambulating and tossing ball, for category generation, min v.c./ mod difficulty.                    OT Education - 05/06/14 1036    Education provided Yes   Education Details PWR hands, ways to incorporate big movments into ADLs.   Person(s) Educated Patient   Methods Explanation;Demonstration;Handout   Comprehension Verbalized understanding;Returned demonstration          OT Short Term Goals - 05/06/14 1038    OT SHORT TERM GOAL #1   Title Pt will be mod I with HEP - 05/08/2014   Status Achieved   OT SHORT TERM GOAL #2   Title Pt will demonstrate ability to complete 30 minute functional activity of moderate exertion without rest break.    Baseline met per pt report, unloading car, and setting up at craft shows   Status Achieved   OT [place] #3   Title Pt will demonstrate ability to complete 30 minute activity of moderate exertion with no more than one LOB   Status On-going   OT SHORT TERM GOAL #4   Title Pt will reduce simulated eating task on physical performance test time by 2 seconds (baseline= 14.98)   Baseline 05/06/14- met 10.12 secs   Status Achieved   OT SHORT TERM GOAL #5  Title Pt will reduce simluated dressing task on Physical Performance test by 5 seconds (baseline = 29.48)   Baseline 05/06/14 met- 12.34 secs   Status Achieved           OT Long Term Goals - 04/10/14 1207    OT LONG TERM GOAL #1   Title Pt will be mod I with upgraded HEP prn - 06/05/2014   Status New   OT LONG TERM GOAL #2   Title Pt will tolerate 40 minutes of ambulatory functional task of moderate exertion without rest break   Status New   OT LONG TERM GOAL #3   Title Pt will tolerate 40 miinutes of ambulatory functional task of moderate exertion without loss of balance.   Status New   OT LONG TERM GOAL #4   Title Pt will decrease simluated eating task on Physical Performance Test by 4 seconds  (baseline = 14.98)   Status New   OT LONG TERM GOAL #5   Title Pt will decrease simulated dressing task on Physical Peformance test by 10 seconds (baseline = 29.48)   Status New   OT LONG TERM GOAL #6   Title Pt will decreased 9 hole peg test by 3 seconds combined (baseline = R= 27.03, L= 25.06)   Status New               Plan - 05/07/14 2202    Clinical Impression Statement Pt is progressing well towards goals for ADLS.   Plan coordination, ADLS strategies   OT Home Exercise Plan coordination HEP issued   Consulted and Agree with Plan of Care Patient        Problem List Patient Active Problem List   Diagnosis Date Noted  . Tremor 06/17/2013    Amunique Neyra 05/07/2014, 12:33 PM Laura Lawrence, OTR/L [phone] Phone: [phone] 12:33 PM 05/07/2014 [facility] [address] [place], [place], [address] Phone: [phone]   Fax:  [phone]

## 2014-05-13 ENCOUNTER — Ambulatory Visit: Payer: BLUE CROSS/BLUE SHIELD | Admitting: Physical Therapy

## 2014-05-13 ENCOUNTER — Ambulatory Visit: Payer: BLUE CROSS/BLUE SHIELD | Admitting: Occupational Therapy

## 2014-05-13 DIAGNOSIS — R269 Unspecified abnormalities of gait and mobility: Secondary | ICD-10-CM

## 2014-05-13 DIAGNOSIS — R293 Abnormal posture: Secondary | ICD-10-CM

## 2014-05-13 DIAGNOSIS — Z7409 Other reduced mobility: Secondary | ICD-10-CM

## 2014-05-13 DIAGNOSIS — R279 Unspecified lack of coordination: Secondary | ICD-10-CM

## 2014-05-13 NOTE — Patient Instructions (Signed)
1. Grip Strengthening (Resistive Putty)   Squeeze putty using thumb and all fingers. Repeat _20___ times. Do __2__ sessions per day.   2. Roll putty into tube on table and pinch between each finger and thumb x 10 reps each. (can do ring and small finger together)     Copyright  VHI. All rights reserved.   

## 2014-05-13 NOTE — Therapy (Signed)
[facility] [address] [place], [place], [address] Phone: [phone]   Fax:  [phone]  Occupational Therapy Treatment  Patient Details  Name: Laura Lawrence MRN: [id number] Date of Birth: 03-Aug-1955 Referring Provider:  Benito Mccreedy, MD  Encounter Date: 05/13/2014      OT End of Session - 05/13/14 1048    Visit Number 7   Number of Visits 16   Date for OT Re-Evaluation 06/05/14   Authorization Type BCBS   OT Start Time 1022   OT Stop Time 1100   OT Time Calculation (min) 38 min   Activity Tolerance Patient tolerated treatment well   Behavior During Therapy [facility] for tasks assessed/performed      Past Medical History  Diagnosis Date  . Rheumatoid arthritis   . Hypercholesterolemia   . Hypertension   . Multiple allergies     No past surgical history on file.  There were no vitals filed for this visit.  Visit Diagnosis:  Lack of coordination  Impaired functional mobility and activity tolerance  Abnormal posture      Subjective Assessment - 05/13/14 1048    Pertinent History see epic snapshot   Currently in Pain? No/denies       Treatment: Arm bike for 6 mins for conditioning, min v.c. For speed, pt was able to maintain 33-38 RPM Dynamic reaching activities with bilateral UE's to copy small peg design, min v.c. Dual tasking for physical and cognitive task min v.c./difficulty Tossing scarf between hands with large amplitude movements, while stepping, min v.c. and several LOB, minguard assist.                       OT Short Term Goals - 05/06/14 1038    OT SHORT TERM GOAL #1   Title Pt will be mod I with HEP - 05/08/2014   Status Achieved   OT SHORT TERM GOAL #2   Title Pt will demonstrate ability to complete 30 minute functional activity of moderate exertion without rest break.    Baseline met per pt report, unloading car, and setting up at craft shows   Status  Achieved   OT [place] #3   Title Pt will demonstrate ability to complete 30 minute activity of moderate exertion with no more than one LOB   Status On-going   OT SHORT TERM GOAL #4   Title Pt will reduce simulated eating task on physical performance test time by 2 seconds (baseline= 14.98)   Baseline 05/06/14- met 10.12 secs   Status Achieved   OT SHORT TERM GOAL #5   Title Pt will reduce simluated dressing task on Physical Performance test by 5 seconds (baseline = 29.48)   Baseline 05/06/14 met- 12.34 secs   Status Achieved           OT Long Term Goals - 04/10/14 1207    OT LONG TERM GOAL #1   Title Pt will be mod I with upgraded HEP prn - 06/05/2014   Status New   OT LONG TERM GOAL #2   Title Pt will tolerate 40 minutes of ambulatory functional task of moderate exertion without rest break   Status New   OT LONG TERM GOAL #3   Title Pt will tolerate 40 miinutes of ambulatory functional task of moderate exertion without loss of balance.   Status New   OT LONG TERM GOAL #4   Title Pt will decrease simluated eating task on Physical  Performance Test by 4 seconds (baseline = 14.98)   Status New   OT LONG TERM GOAL #5   Title Pt will decrease simulated dressing task on Physical Peformance test by 10 seconds (baseline = 29.48)   Status New   OT LONG TERM GOAL #6   Title Pt will decreased 9 hole peg test by 3 seconds combined (baseline = R= 27.03, L= 25.06)   Status New               Plan - 05/13/14 1329    Clinical Impression Statement Pt is progressing well towards goals. Plan to discharge next week or later this week.   Rehab Potential Good   Clinical Impairments Affecting Rehab Potential decreased strength, decreased coordination, decreased endurance,bradykinesia   OT Frequency 2x / week   OT Duration 8 weeks   OT Treatment/Interventions Self-care/ADL training;DME and/or AE instruction;Energy conservation;Neuromuscular education;Therapeutic exercise;Advertising account executive;Therapeutic activities;Balance training;Patient/family education;Cognitive remediation/compensation   Plan coordiantion, ADL strategies   OT Home Exercise Plan coordination HEP issued   Consulted and Agree with Plan of Care Patient        Problem List Patient Active Problem List   Diagnosis Date Noted  . Tremor 06/17/2013    [name] 05/13/2014, 1:31 PM [name], OTR/L [phone] Phone: [phone] 1:32 PM 05/13/2014 [place] [address] [place], [place], [address] Phone: [phone]   Fax:  [phone]

## 2014-05-14 NOTE — Therapy (Signed)
[facility] [address] [place], [place], [address] Phone: [phone]   Fax:  [phone]  Physical Therapy Treatment  Patient Details  Name: Laura Lawrence MRN: [id number] Date of Birth: 09/27/1955 Referring Provider:  Benito Mccreedy, MD  Encounter Date: 05/13/2014      PT End of Session - 05/14/14 1316    Visit Number 14   Number of Visits 17   Date for PT Re-Evaluation 06/15/14   Authorization Type BCBS   PT Start Time [phone]   PT Stop Time 1015   PT Time Calculation (min) 38 min   Equipment Utilized During Treatment Gait belt   Activity Tolerance Patient tolerated treatment well   Behavior During Therapy [facility] for tasks assessed/performed      Past Medical History  Diagnosis Date  . Rheumatoid arthritis   . Hypercholesterolemia   . Hypertension   . Multiple allergies     No past surgical history on file.  There were no vitals filed for this visit.  Visit Diagnosis:  Abnormal posture  Abnormality of gait      Subjective Assessment - 05/13/14 0936    Symptoms Feels like toes may have a little bit of neuropathy in them; not as limber   Currently in Pain? No/denies   Aggravating Factors  coldnes/stiffness in small joints   Pain Relieving Factors took Aleve this morning       Neuro Reeducation:  Pt performs Sensory Organization test (SOT), with total score of 75/100, improved from 56/100.  Pt also noted to have vision and vestibular system use improved to WNL.    Self Care:  Discussed results of SOT, including improvements from exercises.  Discussed compensations, fall prevention, techniques to decrease freezing/hastening of gait.  Pt verbalizes understanding and verbalizes overall improvements in gait and balance, with no falls in the past month.                        PT Education - 05/14/14 1315    Education provided Yes   Education Details Results of SOT, POC, with  plans to D/C next visit; plans for screens   Person(s) Educated Patient   Methods Explanation;Demonstration;Handout   Comprehension Verbalized understanding             PT Long Term Goals - 05/13/14 1003    PT LONG TERM GOAL #1   Title Pt will be independent with progressive HEP to address transfers, gait and dynamic balance. (Updated 04/15/14>Target 05/16/14)   Time 4   Period Weeks   Status New   PT LONG TERM GOAL #2   Title improve Sensory Organization test composite score by at least 10% for improved overall balance. (Target 05/16/14)   Baseline SOT score 75/100 on 05/13/14   Status Achieved   PT LONG TERM GOAL #3   Title improve Functional Gait Assessment to at least 22/30 for decreased fall risk. (Target 05/16/14)   Status On-going   PT LONG TERM GOAL #4   Title verbalize/demonstrate understanding of techniques to reduce hastening/freezing episodes with gait for decreased risk of falls. (Target 05/16/14)   Baseline verbalizes increased awareness of posture, cadence with walking   Status Achieved   PT LONG TERM GOAL #5   Title verbalize plans for continued community fitness upon D/C from PT. (Target 05/16/14)               Plan - 05/14/14 1317    Clinical Impression Statement  Pt has met #2 and #4.  Remaining goals to be checked next visit, anticipate pt meeting goals with plans for discharge next visit.  Pt notes improved overall functional mobility and no falls.  Pt shows increased use of visual and vestibular system use for balance on Sensory Organization test.   Pt will benefit from skilled therapeutic intervention in order to improve on the following deficits Abnormal gait;Decreased balance;Difficulty walking;Postural dysfunction   Rehab Potential Good   PT Frequency 2x / week   PT Duration 4 weeks   PT Treatment/Interventions ADLs/Self Care Home Management;Therapeutic activities;Functional mobility training;Stair training;Gait training;Therapeutic exercise;Neuromuscular  re-education;Balance training;Patient/family education   PT Next Visit Plan Check remaining goals and  plan for D/C   Consulted and Agree with Plan of Care Patient        Problem List Patient Active Problem List   Diagnosis Date Noted  . Tremor 06/17/2013    Nikoloz Huy W. 05/14/2014, 1:27 PM Laurent Cargile Gerrit Friends, PT 05/14/2014 1:27 PM Phone: [phone] Fax: [facility] [address] [place], [place], [address] Phone: [phone]   Fax:  [phone]

## 2014-05-15 ENCOUNTER — Ambulatory Visit: Payer: BLUE CROSS/BLUE SHIELD | Admitting: Occupational Therapy

## 2014-05-15 ENCOUNTER — Ambulatory Visit: Payer: BLUE CROSS/BLUE SHIELD | Admitting: Physical Therapy

## 2014-05-15 DIAGNOSIS — R269 Unspecified abnormalities of gait and mobility: Secondary | ICD-10-CM | POA: Diagnosis not present

## 2014-05-15 DIAGNOSIS — Z7409 Other reduced mobility: Secondary | ICD-10-CM

## 2014-05-15 DIAGNOSIS — R279 Unspecified lack of coordination: Secondary | ICD-10-CM

## 2014-05-15 DIAGNOSIS — R293 Abnormal posture: Secondary | ICD-10-CM

## 2014-05-15 NOTE — Therapy (Signed)
Laura Lawrence Phone: [phone]   Fax:  [phone]  Physical Therapy Treatment  Patient Details  Name: Laura Lawrence MRN: [id number] Date of Birth: Jan 22, 1956 Referring Provider:  Benito Mccreedy, MD  Encounter Date: 05/15/2014      PT End of Session - 05/15/14 1340    Visit Number 15   Number of Visits 17   PT Start Time 2637   PT Stop Time 1344   PT Time Calculation (min) 29 min   Activity Tolerance Patient tolerated treatment well      Past Medical History  Diagnosis Date  . Rheumatoid arthritis   . Hypercholesterolemia   . Hypertension   . Multiple allergies     No past surgical history on file.  There were no vitals filed for this visit.  Visit Diagnosis:  Abnormality of gait      Subjective Assessment - 05/15/14 1317    Symptoms Worked pretty hard on some craft items yesterday and I'm very sore today.   Currently in Pain? Yes   Pain Score 3    Pain Location Hand   Pain Orientation Right;Left   Pain Descriptors / Indicators Aching   Pain Frequency Intermittent   Aggravating Factors  sawing, sanding craft project yesterday aggravates   Pain Relieving Factors Aleve            [facility] PT Assessment - 05/15/14 1319    Observation/Other Assessments   Focus on Therapeutic Outcomes (FOTO)  Functional Status Score 60 (up from 34); Neuro QOL score 40 (up from 32.1)   Transfers   Transfers Sit to Stand;Stand to Sit   Sit to Stand 7: Independent;Five times sit to stand  5x sit<>stand:  17.04 sec   Stand to Sit 7: Independent   Ambulation/Gait   Gait velocity 8.82 se   Functional Gait  Assessment   Gait assessed  Yes   Gait Level Surface Walks 20 ft in less than 5.5 sec, no assistive devices, good speed, no evidence for imbalance, normal gait pattern, deviates no more than 6 in outside of the 12 in walkway width.   Change in Gait Speed Able to  smoothly change walking speed without loss of balance or gait deviation. Deviate no more than 6 in outside of the 12 in walkway width.   Gait with Horizontal Head Turns Performs head turns smoothly with no change in gait. Deviates no more than 6 in outside 12 in walkway width   Gait with Vertical Head Turns Performs head turns with no change in gait. Deviates no more than 6 in outside 12 in walkway width.   Gait and Pivot Turn Pivot turns safely within 3 sec and stops quickly with no loss of balance.   Step Over Obstacle Is able to step over one shoe box (4.5 in total height) without changing gait speed. No evidence of imbalance.   Gait with Narrow Base of Support Ambulates less than 4 steps heel to toe or cannot perform without assistance.   Gait with Eyes Closed Walks 20 ft, no assistive devices, good speed, no evidence of imbalance, normal gait pattern, deviates no more than 6 in outside 12 in walkway width. Ambulates 20 ft in less than 7 sec.   Ambulating Backwards Walks 20 ft, no assistive devices, good speed, no evidence for imbalance, normal gait   Steps Alternating feet, must use rail.   Total Score 25  PT Long Term Goals - 05/15/14 1319    PT LONG TERM GOAL #1   Title Pt will be independent with progressive HEP to address transfers, gait and dynamic balance. (Updated 04/15/14>Target 05/16/14)   Baseline Pt verbalizes exercise program including PWR! Moves and balance and walking(05/15/14)   Status Achieved   PT LONG TERM GOAL #2   Title improve Sensory Organization test composite score by at least 10% for improved overall balance. (Target 05/16/14)   Status Achieved   PT LONG TERM GOAL #3   Title improve Functional Gait Assessment to at least 22/30 for decreased fall risk. (Target 05/16/14)   Baseline 05/15/14: Score 25/30   Status Achieved   PT LONG TERM GOAL #4   Title verbalize/demonstrate understanding of techniques to reduce  hastening/freezing episodes with gait for decreased risk of falls. (Target 05/16/14)   Status Achieved   PT LONG TERM GOAL #5   Title verbalize plans for continued community fitness upon D/C from PT. (Target 05/16/14)   Status Achieved               Plan - 05/15/14 1341    Clinical Impression Statement Pt has met LTG#1, 3, 5.  Pt verbalizes and demonstrates overall functional improvement, as well as verbalizes plan for continued exercise.  Pt is performing PT HEP daily in conjunction with OT exercises.  Pt is appropriate for discharge this visit.   PT Next Visit Plan Discharge this visit   Recommended Other Services Plan for PT PD screen in 6-8 months   Consulted and Agree with Plan of Care Patient        Problem List Patient Active Problem List   Diagnosis Date Noted  . Tremor 06/17/2013   PHYSICAL THERAPY DISCHARGE SUMMARY  Visits from Start of Care: 15  Current functional level related to goals / functional outcomes:     PT Long Term Goals - 05/15/14 1319    PT LONG TERM GOAL #1   Title Pt will be independent with progressive HEP to address transfers, gait and dynamic balance. (Updated 04/15/14>Target 05/16/14)   Baseline Pt verbalizes exercise program including PWR! Moves and balance and walking(05/15/14)   Status Achieved   PT LONG TERM GOAL #2   Title improve Sensory Organization test composite score by at least 10% for improved overall balance. (Target 05/16/14)   Status Achieved   PT LONG TERM GOAL #3   Title improve Functional Gait Assessment to at least 22/30 for decreased fall risk. (Target 05/16/14)   Baseline 05/15/14: Score 25/30   Status Achieved   PT LONG TERM GOAL #4   Title verbalize/demonstrate understanding of techniques to reduce hastening/freezing episodes with gait for decreased risk of falls. (Target 05/16/14)   Status Achieved   PT LONG TERM GOAL #5   Title verbalize plans for continued community fitness upon D/C from PT. (Target 05/16/14)   Status  Achieved       Remaining deficits: Occasional balance loss on compliant surfaces   Education / Equipment: Pt has been educated in HEP, fall prevention, community Parkinson's resources and ongoing fitness  Plan: Patient agrees to discharge.  Patient goals were not met. Patient is being discharged due to meeting the stated rehab goals.  ?????      [name] W. 05/16/2014, 12:52 PM [name], PT 05/16/2014 12:53 PM Phone: [phone] Fax: [facility] [address] [place], [place], [address] Phone: [phone]   Fax:  [phone]

## 2014-05-16 NOTE — Therapy (Addendum)
[facility] [address] [place], [place], [address] Phone: [phone]   Fax:  [phone]  Occupational Therapy Treatment  Patient Details  Name: Laura Lawrence MRN: [id number] Date of Birth: 06-Jul-1955 Referring Provider:  Benito Mccreedy, MD  Encounter Date: 05/15/2014      OT End of Session - 05/16/14 1008    Visit Number 8   Number of Visits 8   Authorization Type BCBS   OT Start Time 1403   OT Stop Time 1430   OT Time Calculation (min) 27 min   Activity Tolerance Patient tolerated treatment well   Behavior During Therapy [facility] for tasks assessed/performed      Past Medical History  Diagnosis Date  . Rheumatoid arthritis   . Hypercholesterolemia   . Hypertension   . Multiple allergies     No past surgical history on file.  There were no vitals filed for this visit.  Visit Diagnosis:  Lack of coordination  Impaired functional mobility and activity tolerance  Abnormal posture      Subjective Assessment - 05/15/14 1406    Subjective  Pt reports she was hurting this morning, she took aleeve this a.m. and it helped   Pertinent History see epic snapshot   Currently in Pain? Yes   Pain Score 3    Pain Location Hand   Pain Orientation Right;Left   Pain Descriptors / Indicators Aching   Pain Type Chronic pain   Pain Onset More than a month ago   Pain Frequency Intermittent   Aggravating Factors  sawing/ sanding craft projects   Pain Relieving Factors Aleeve   Multiple Pain Sites No          Treatment: Therapist checked progress towards goals and reinforced adapted strategies for ADLS/IADLS including use of screwdriver with big movements. Pt agrees with d/c                     OT Short Term Goals - 05/15/14 1419    OT SHORT TERM GOAL #1   Title Pt will be mod I with HEP - 05/08/2014   Status Achieved   OT SHORT TERM GOAL #2   Title Pt will demonstrate ability to  complete 30 minute functional activity of moderate exertion without rest break.    Baseline met per pt report, unloading car, and setting up at craft shows   Status Achieved   OT SHORT TERM GOAL #3   Title Pt will demonstrate ability to complete 30 minute activity of moderate exertion with no more than one LOB   Status Achieved   OT SHORT TERM GOAL #4   Title Pt will reduce simulated eating task on physical performance test time by 2 seconds (baseline= 14.98)   Baseline 05/06/14- met 10.12 secs   Status Achieved   OT SHORT TERM GOAL #5   Title Pt will reduce simluated dressing task on Physical Performance test by 5 seconds (baseline = 29.48)   Baseline 05/06/14 met- 12.34 secs   Status Achieved           OT Long Term Goals - 05/15/14 1419    OT LONG TERM GOAL #1   Title Pt will be mod I with upgraded HEP prn - 06/05/2014   Baseline putty   Status Achieved   OT LONG TERM GOAL #2   Title Pt will tolerate 40 minutes of ambulatory functional task of moderate exertion without rest break   Status Achieved  OT LONG TERM GOAL #3   Title Pt will tolerate 40 miinutes of ambulatory functional task of moderate exertion without loss of balance.   Status Achieved   OT LONG TERM GOAL #4   Title Pt will decrease simluated eating task on Physical Performance Test by 4 seconds (baseline = 14.98)   Baseline met, 05/15/14 - 9.16 secs   Status Achieved   OT LONG TERM GOAL #5   Title Pt will decrease simulated dressing task on Physical Peformance test by 10 seconds (baseline = 29.48)   Baseline PPT#4 10.72 secs.   Status Achieved   OT LONG TERM GOAL #6   Title Pt will decreased 9 hole peg test by 3 seconds combined (baseline = R= 27.03, L= 25.06)   Baseline 05/15/14- RUE 23. 78 secs LUE  22.66 secs  met for RUE, not fully for left    Status Partially Met     .OCCUPATIONAL THERAPY DISCHARGE SUMMARY   Current functional level related to goals / functional outcomes: Pt made excellent progress  achieving 5/6 long term goals.   Remaining deficits: Decreased strength, decreased coordination, bradykinesia, decreased balance, tremor   Education / Equipment: Pt was educated regarding:HEP, and adapted strategies for ADLS. Pt verbalized understanding of all education. Plan: Patient agrees to discharge.  Patient goals were partially met. Patient is being discharged due to being pleased with the current functional level.  ?????              Problem List Patient Active Problem List   Diagnosis Date Noted  . Tremor 06/17/2013    Sevanna Ballengee 05/16/2014, 5:18 PM [name], OTR/L [phone] Phone: [phone] 5:18 PM 05/16/2014 [facility] [address] [place], [place], [address] Phone: [phone]   Fax:  [phone]

## 2014-05-20 ENCOUNTER — Ambulatory Visit: Payer: BLUE CROSS/BLUE SHIELD | Admitting: Occupational Therapy

## 2014-05-20 ENCOUNTER — Ambulatory Visit (INDEPENDENT_AMBULATORY_CARE_PROVIDER_SITE_OTHER): Payer: BLUE CROSS/BLUE SHIELD | Admitting: Diagnostic Neuroimaging

## 2014-05-20 ENCOUNTER — Ambulatory Visit: Payer: BLUE CROSS/BLUE SHIELD | Admitting: Physical Therapy

## 2014-05-20 ENCOUNTER — Encounter: Payer: Self-pay | Admitting: Diagnostic Neuroimaging

## 2014-05-20 VITALS — BP 129/76 | HR 73 | Ht 68.0 in | Wt 210.4 lb

## 2014-05-20 DIAGNOSIS — G44229 Chronic tension-type headache, not intractable: Secondary | ICD-10-CM

## 2014-05-20 DIAGNOSIS — G20 Parkinson's disease: Secondary | ICD-10-CM

## 2014-05-20 NOTE — Patient Instructions (Signed)
Continue current medications. 

## 2014-05-20 NOTE — Progress Notes (Signed)
[facility]  PATIENT: Laura Lawrence DOB: 26-Apr-1955  REFERRING CLINICIAN:  HISTORY FROM: patient and husband  REASON FOR VISIT: follow up   HISTORICAL  CHIEF COMPLAINT:  Chief Complaint  Patient presents with  . Follow-up    Parkinson's Disease     HISTORY OF PRESENT ILLNESS:   UPDATE 05/20/14: Since last visit, doing well. Swallow testing per PCP showed primary esophageal dysphagia. No falls. Taking carb/levo 1 tab q3 hrs, 4-5 x per day, and feeling better. Headaches continue, mild tension type. No mood lability.   UPDATE 02/11/14: Since last visit patient reports increasing headaches, constipation, bladder incontinence, early morning nausea, unsteadiness and progressive hair loss. Patient tolerating carbidopa/levodopa. 2-3 months ago she stopped her sertraline, which she had been taking for 15 years for anxiety/depression and headache treatment.  UPDATE 10/10/13 (PS): Overall doing well. Had one fall since last visit, notes her feet got tied up and wouldn't move. Notes doing well with the Sinemet. Currently taking Sinemet 25-100 QID at 8am, noon, 4pm and 8pm. Denies any motor fluctuations, no wearing off noted. No dyskinesias. Happy with current control, feels it has made a big difference. Sleeping well, minimal REM behavior disorder. Notes prior lab test showing elevated LFTs. Following up with rheumatology, they may consider tapering off of the methotrexate.   UPDATE 07/31/13 (PS): At last visit she had a MRI of the lumbar spine which showed some degenerative changes but no signs of compression. Continues to have difficulty with gait, notes episodes where she will start walking, "faster and faster" and not be able to stop until she falls over. Continues to have mild rest and postural tremor. Notes worsening handwriting, described as messy and smaller.   07/16/13 (PS):  At last visit she had a MRI of the brain and C spine both of which were overall unremarkable.  Returns today with continued difficulty with her walking, feels very off balance, unsteady on her feet. Notes continued rest and postural/action tremor. No other acute issues at this time.   PRIOR HPI (06/17/13, Dr. Elspeth Cho): Laura Lawrence is a 59 y.o. female here as a referral from Dr. Julio Sicks for tremors. Feels symptoms started around 2 months ago and has gotten progressively worse since then. Describes both a rest and action/postural tremor. Handwriting has gotten messier, no change in size. Notes some generalized slowing down of her movements. She feels off balance, having falls recently. Having difficulty with fine motor tasks. Notes some muscle cramps, aching in her legs. Tremor worse with stress/anxiety. No change with caffeine. Does not drink EtOH. No recent changes to her medications. Around Thanksgiving has noted loss of bladder control, states she will go without even knowing she was going. No family history of tremor. Has history of HTN, no known TIA or strokes in the past.    REVIEW OF SYSTEMS: Full 14 system review of systems performed and notable only for incontinence.   ALLERGIES: Allergies  Allergen Reactions  . Codeine   . Guaifenesin & Derivatives   . Penicillins   . Sulfa Antibiotics     HOME MEDICATIONS: Outpatient Prescriptions Prior to Visit  Medication Sig Dispense Refill  . carbidopa-levodopa (SINEMET) 25-100 MG per tablet Take 1.5 tablets by mouth 4 (four) times daily. 180 tablet 12  . Cholecalciferol (VITAMIN D-3) 1000 UNITS CAPS Take 1,000 Units by mouth.    . clindamycin (CLEOCIN) 300 MG capsule     . DOCUSATE SODIUM PO Take 100 mg by mouth.    Marland Kitchen  Multiple Vitamins-Minerals (MULTIVITAMIN WITH MINERALS) tablet Take 1 tablet by mouth daily.    Marland Kitchen NAPROXEN PO Take 200 mg by mouth.    . omega-3 acid ethyl esters (LOVAZA) 1 G capsule Take by mouth 2 (two) times daily.    . sertraline (ZOLOFT) 25 MG tablet Take 1 tablet (25 mg total) by mouth daily. 30 tablet 12   . Adalimumab (HUMIRA) 40 MG/0.8ML PSKT Inject into the skin. Every two weeks.    Marland Kitchen NORTRIPTYLINE HCL PO Take 2 capsules by mouth at bedtime.      No facility-administered medications prior to visit.    PAST MEDICAL HISTORY: Past Medical History  Diagnosis Date  . Rheumatoid arthritis   . Hypercholesterolemia   . Hypertension   . Multiple allergies     PAST SURGICAL HISTORY: No past surgical history on file.  FAMILY HISTORY: Family History  Problem Relation Age of Onset  . Heart disease Father   . Osteoporosis Father   . Hypertension Father   . Leukemia Father   . Diabetes Mother   . Arthritis Mother   . Asthma Mother   . Hypertension Mother   . Bursitis Mother   . Gout    . Brain cancer Paternal Aunt     SOCIAL HISTORY:  History   Social History  . Marital Status: Married    Spouse Name: Casimiro Needle  . Number of Children: 1  . Years of Education: college   Occupational History  . self employed    Social History Main Topics  . Smoking status: Never Smoker   . Smokeless tobacco: Never Used  . Alcohol Use: No  . Drug Use: No  . Sexual Activity: Not on file   Other Topics Concern  . Not on file   Social History Narrative   Patient is married Casimiro Needle), 2 children   Associates degree   Right handed   0-1 cups of coffee a day     PHYSICAL EXAM  Filed Vitals:   05/20/14 1341  BP: 129/76  Pulse: 73  Height: 5\' 8"  (1.727 m)  Weight: 210 lb 6.4 oz (95.437 kg)    Body mass index is 32 kg/(m^2).  No exam data present  No flowsheet data found.  GENERAL EXAM: Patient is in no distress; well developed, nourished and groomed; neck is supple  CARDIOVASCULAR: Regular rate and rhythm, no murmurs, no carotid bruits  NEUROLOGIC: MENTAL STATUS: awake, alert, language fluent, comprehension intact, naming intact, fund of knowledge appropriate; MASKED FACIES CRANIAL NERVE:  pupils equal and reactive to light, visual fields full to confrontation,  extraocular muscles intact, no nystagmus, facial sensation and strength symmetric, hearing intact, palate elevates symmetrically, uvula midline, shoulder shrug symmetric, tongue midline; MASKED FACIES MOTOR: normal bulk; MILD RIGIDITY IN LUE > RUE; NO TREMOR. MOD BRADYKINESIA IN BUE AND BLE (L SLOWER THAN R); full strength in the BUE, BLE; NO TREMOR SENSORY: normal and symmetric to light touch  COORDINATION: finger-nose-finger, fine finger movements normal REFLEXES: deep tendon reflexes present and symmetric GAIT/STATION: narrow based gait; DECR ARM SWING    DIAGNOSTIC DATA (LABS, IMAGING, TESTING) - I reviewed patient records, labs, notes, testing and imaging myself where available.  No results found for: WBC, HGB, HCT, MCV, PLT No results found for: NA, K, CL, CO2, GLUCOSE, BUN, CREATININE, CALCIUM, PROT, ALBUMIN, AST, ALT, ALKPHOS, BILITOT, GFRNONAA, GFRAA No results found for: CHOL, HDL, LDLCALC, LDLDIRECT, TRIG, CHOLHDL No results found for: Lab Results  Component Value Date   VITAMINB12 >  1999* 07/16/2013   Lab Results  Component Value Date   TSH 2.320 07/16/2013    I reviewed images myself and agree with interpretation. -VRP  07/09/13 MRI brain (without) demonstrating: 1. Few (~4) punctate foci of periventricular and subcortical non-specific gliosis. 2. No acute findings.  07/09/13 MRI cervical spine (without) demonstrating mild disc bulging at C3-4 and C4-5. No spinal stenosis or foraminal narrowing. No intrinsic or compressive spinal cord lesions.  07/23/13 MRI lumbar spine (without) demonstrating: 1. At L4-5: disc bulging and facet and ligamentum flavum hypertrophy with moderate spinal stenosis and mild biforaminal stenosis  2. At L5-S1: disc bulging and facet arthropathy, with mild spinal stenosis, left lateral recess stenosis, mild right and moderate left foraminal stenosis  3. At L3-4: disc bulging, leftward disc protrusion, facet and ligamentum flavum hypertrophy  with mild spinal stenosis and mild biforaminal stenosis      ASSESSMENT AND PLAN  59 y.o. year old female here with parkinson's disease, lumbar spinal stenosis, depression and rheumatoid arthritis.   PLAN: - stop sertraline (no mood issues, and not helping headaches) - continue carb/levo 25/100 q3 hours (divided 4-5 times per day)  Return in about 6 months (around 11/19/2014).  I spent 25 minutes of face to face time with patient. Greater than 50% of time was spent in counseling and coordination of care with patient.  Suanne Marker, MD 05/20/2014, 2:24 PM Certified in Neurology, Neurophysiology and Neuroimaging  [facility] [address], [address] [place], [place] [address] [phone]

## 2014-05-22 ENCOUNTER — Ambulatory Visit: Payer: BLUE CROSS/BLUE SHIELD | Admitting: Physical Therapy

## 2014-05-22 ENCOUNTER — Encounter: Payer: BLUE CROSS/BLUE SHIELD | Admitting: Occupational Therapy

## 2014-06-04 ENCOUNTER — Other Ambulatory Visit: Payer: Self-pay | Admitting: Neurology

## 2014-06-04 NOTE — Telephone Encounter (Signed)
Last OV note says: - continue carb/levo 25/100 q3 hours (divided 4-5 times per day)

## 2014-11-18 ENCOUNTER — Ambulatory Visit (INDEPENDENT_AMBULATORY_CARE_PROVIDER_SITE_OTHER): Payer: BLUE CROSS/BLUE SHIELD | Admitting: Diagnostic Neuroimaging

## 2014-11-18 ENCOUNTER — Encounter: Payer: Self-pay | Admitting: Diagnostic Neuroimaging

## 2014-11-18 VITALS — BP 121/62 | HR 68 | Ht 68.0 in | Wt 201.0 lb

## 2014-11-18 DIAGNOSIS — G20 Parkinson's disease: Secondary | ICD-10-CM

## 2014-11-18 MED ORDER — CARBIDOPA-LEVODOPA ER 23.75-95 MG PO CPCR: 4.0000 | ORAL_CAPSULE | Freq: Three times a day (TID) | ORAL | Status: DC

## 2014-11-18 NOTE — Patient Instructions (Signed)
Thank you for coming to see [place] at [facility]. I hope we have been able to provide you high quality care today.  You may receive a patient satisfaction survey over the next few weeks. We would appreciate your feedback and comments so that we may continue to improve ourselves and the health of our patients.  - stop carbidopa levodopa then start rytary (23.75/95) 4 caps three times per day (once approved) - see rytary.com for more information   ~~~~~~~~~~~~~~~~~~~~~~~~~~~~~~~~~~~~~~~~~~~~~~~~~~~~~~~~~~~~~~~~~  DR. PENUMALLI'S GUIDE TO HAPPY AND HEALTHY LIVING These are some of my general health and wellness recommendations. Some of them may apply to you better than others. Please use common sense as you try these suggestions and feel free to ask me any questions.   ACTIVITY/FITNESS Mental, social, emotional and physical stimulation are very important for brain and body health. Try learning a new activity (arts, music, language, sports, games).  Keep moving your body to the best of your abilities. You can do this at home, inside or outside, the park, community center, gym or anywhere you like. Consider a physical therapist or personal trainer to get started. Consider the app Sworkit. Fitness trackers such as smart-watches, smart-phones or Fitbits can help as well.   NUTRITION Eat more plants: colorful vegetables, nuts, seeds and berries.  Eat less sugar, salt, preservatives and processed foods.  Avoid toxins such as cigarettes and alcohol.  Drink water when you are thirsty. Warm water with a slice of lemon is an excellent morning drink to start the day.  Consider these websites for more information The Nutrition Source (https://www.henry-hernandez.biz/) [employer] Nutrition (WindowBlog.ch)   RELAXATION Consider practicing mindfulness meditation or other relaxation techniques such as deep breathing, prayer, yoga, tai chi,  massage. See website mindful.org or the apps Headspace or Calm to help get started.   SLEEP Try to get at least 7-8+ hours sleep per day. Regular exercise and reduced caffeine will help you sleep better. Practice good sleep hygeine techniques. See website sleep.org for more information.   PLANNING Prepare estate planning, living will, healthcare POA documents. Sometimes this is best planned with the help of an attorney. Theconversationproject.org and agingwithdignity.org are excellent resources.

## 2014-11-18 NOTE — Progress Notes (Signed)
[facility]  PATIENT: Laura Lawrence DOB: 1955/02/27  REFERRING CLINICIAN:  HISTORY FROM: patient and husband  REASON FOR VISIT: follow up   HISTORICAL  CHIEF COMPLAINT:  Chief Complaint  Patient presents with  . Parkinson's disease    rm 6, husband Casimiro Needle  . Follow-up    6 month    HISTORY OF PRESENT ILLNESS:   UPDATE 11/18/14: Since last visit, continues on carb/levo 25/100 every 2.5 hours (5-6 tabs per day). Some wearing off after 2-3 hours. Also with new hematuria, and left renal cyst and pancreatic cyst, pending further workup. 2 more falls since last visit. Not using walker that much, but is planning to do so now. PT and OT helped back in March and April 2016.   UPDATE 05/20/14: Since last visit, doing well. Swallow testing per PCP showed primary esophageal dysphagia. No falls. Taking carb/levo 1 tab q3 hrs, 4-5 x per day, and feeling better. Headaches continue, mild tension type. No mood lability.   UPDATE 02/11/14: Since last visit patient reports increasing headaches, constipation, bladder incontinence, early morning nausea, unsteadiness and progressive hair loss. Patient tolerating carbidopa/levodopa. 2-3 months ago she stopped her sertraline, which she had been taking for 15 years for anxiety/depression and headache treatment.  UPDATE 10/10/13 (PS): Overall doing well. Had one fall since last visit, notes her feet got tied up and wouldn't move. Notes doing well with the Sinemet. Currently taking Sinemet 25-100 QID at 8am, noon, 4pm and 8pm. Denies any motor fluctuations, no wearing off noted. No dyskinesias. Happy with current control, feels it has made a big difference. Sleeping well, minimal REM behavior disorder. Notes prior lab test showing elevated LFTs. Following up with rheumatology, they may consider tapering off of the methotrexate.   UPDATE 07/31/13 (PS): At last visit she had a MRI of the lumbar spine which showed some degenerative changes but  no signs of compression. Continues to have difficulty with gait, notes episodes where she will start walking, "faster and faster" and not be able to stop until she falls over. Continues to have mild rest and postural tremor. Notes worsening handwriting, described as messy and smaller.   07/16/13 (PS):  At last visit she had a MRI of the brain and C spine both of which were overall unremarkable. Returns today with continued difficulty with her walking, feels very off balance, unsteady on her feet. Notes continued rest and postural/action tremor. No other acute issues at this time.   PRIOR HPI (06/17/13, Dr. Elspeth Cho): Laura Lawrence is a 59 y.o. female here as a referral from Dr. Julio Sicks for tremors. Feels symptoms started around 2 months ago and has gotten progressively worse since then. Describes both a rest and action/postural tremor. Handwriting has gotten messier, no change in size. Notes some generalized slowing down of her movements. She feels off balance, having falls recently. Having difficulty with fine motor tasks. Notes some muscle cramps, aching in her legs. Tremor worse with stress/anxiety. No change with caffeine. Does not drink EtOH. No recent changes to her medications. Around Thanksgiving has noted loss of bladder control, states she will go without even knowing she was going. No family history of tremor. Has history of HTN, no known TIA or strokes in the past.    REVIEW OF SYSTEMS: Full 14 system review of systems performed and notable only for incontinence.   ALLERGIES: Allergies  Allergen Reactions  . Codeine   . Guaifenesin & Derivatives   . Penicillins   .  Sulfa Antibiotics     HOME MEDICATIONS: Outpatient Prescriptions Prior to Visit  Medication Sig Dispense Refill  . carbidopa-levodopa (SINEMET IR) 25-100 MG per tablet Take one tablet 4-5 times per day every 3 hours as directed 150 tablet 12  . Cholecalciferol (VITAMIN D-3) 1000 UNITS CAPS Take 1,000 Units by mouth.     Marland Kitchen HUMIRA PEN 40 MG/0.8ML PNKT   2  . Multiple Vitamins-Minerals (MULTIVITAMIN WITH MINERALS) tablet Take 1 tablet by mouth daily.    Marland Kitchen NAPROXEN PO Take 200 mg by mouth.    . nortriptyline (PAMELOR) 10 MG capsule Take 20 mg by mouth daily.   2  . omega-3 acid ethyl esters (LOVAZA) 1 G capsule Take by mouth 2 (two) times daily.    . sertraline (ZOLOFT) 25 MG tablet Take 1 tablet (25 mg total) by mouth daily. 30 tablet 12  . clindamycin (CLEOCIN) 300 MG capsule     . DOCUSATE SODIUM PO Take 100 mg by mouth.     No facility-administered medications prior to visit.    PAST MEDICAL HISTORY: Past Medical History  Diagnosis Date  . Rheumatoid arthritis (HCC)   . Hypercholesterolemia   . Hypertension   . Multiple allergies   . Parkinson's disease (HCC)     PAST SURGICAL HISTORY: No past surgical history on file.  FAMILY HISTORY: Family History  Problem Relation Age of Onset  . Heart disease Father   . Osteoporosis Father   . Hypertension Father   . Leukemia Father   . Diabetes Mother   . Arthritis Mother   . Asthma Mother   . Hypertension Mother   . Bursitis Mother   . Gout    . Brain cancer Paternal Aunt     SOCIAL HISTORY:  Social History   Social History  . Marital Status: Married    Spouse Name: Casimiro Needle  . Number of Children: 1  . Years of Education: college   Occupational History  . self employed    Social History Main Topics  . Smoking status: Never Smoker   . Smokeless tobacco: Never Used  . Alcohol Use: No  . Drug Use: No  . Sexual Activity: Not on file   Other Topics Concern  . Not on file   Social History Narrative   Patient is married Casimiro Needle), 2 children   Associates degree   Right handed   0-1 cups of coffee a day     PHYSICAL EXAM  Filed Vitals:   11/18/14 1334  BP: 121/62  Pulse: 68  Height: 5\' 8"  (1.727 m)  Weight: 201 lb (91.173 kg)    Body mass index is 30.57 kg/(m^2).  No exam data present  No flowsheet data  found.  GENERAL EXAM: Patient is in no distress; well developed, nourished and groomed; neck is supple  CARDIOVASCULAR: Regular rate and rhythm, no murmurs, no carotid bruits  NEUROLOGIC: MENTAL STATUS: awake, alert, language fluent, comprehension intact, naming intact, fund of knowledge appropriate; MASKED FACIES CRANIAL NERVE:  pupils equal and reactive to light, visual fields full to confrontation, extraocular muscles intact, no nystagmus, facial sensation and strength symmetric, hearing intact, palate elevates symmetrically, uvula midline, shoulder shrug symmetric, tongue midline; MASKED FACIES MOTOR: normal bulk; MOD RIGIDITY IN LUE > RUE; RARE TREMOR IN LUE WITH CONTRALATERAL MOVEMENTS; MOD BRADYKINESIA IN BUE AND BLE (L SLOWER THAN R); full strength in the BUE, BLE SENSORY: normal and symmetric to light touch  COORDINATION: finger-nose-finger, fine finger movements normal REFLEXES: deep tendon reflexes  present and symmetric GAIT/STATION: narrow based gait; DECR ARM SWING    DIAGNOSTIC DATA (LABS, IMAGING, TESTING) - I reviewed patient records, labs, notes, testing and imaging myself where available.  No results found for: WBC, HGB, HCT, MCV, PLT No results found for: NA, K, CL, CO2, GLUCOSE, BUN, CREATININE, CALCIUM, PROT, ALBUMIN, AST, ALT, ALKPHOS, BILITOT, GFRNONAA, GFRAA No results found for: CHOL, HDL, LDLCALC, LDLDIRECT, TRIG, CHOLHDL No results found for: RVIF5P Lab Results  Component Value Date   VITAMINB12 >1999* 07/16/2013   Lab Results  Component Value Date   TSH 2.320 07/16/2013    I reviewed images myself and agree with interpretation. -VRP  07/09/13 MRI brain (without) demonstrating: 1. Few (~4) punctate foci of periventricular and subcortical non-specific gliosis. 2. No acute findings.  07/09/13 MRI cervical spine (without) demonstrating mild disc bulging at C3-4 and C4-5. No spinal stenosis or foraminal narrowing. No intrinsic or compressive spinal cord  lesions.  07/23/13 MRI lumbar spine (without) demonstrating: 1. At L4-5: disc bulging and facet and ligamentum flavum hypertrophy with moderate spinal stenosis and mild biforaminal stenosis  2. At L5-S1: disc bulging and facet arthropathy, with mild spinal stenosis, left lateral recess stenosis, mild right and moderate left foraminal stenosis  3. At L3-4: disc bulging, leftward disc protrusion, facet and ligamentum flavum hypertrophy with mild spinal stenosis and mild biforaminal stenosis      ASSESSMENT AND PLAN  59 y.o. year old female here with parkinson's disease, lumbar spinal stenosis, depression and rheumatoid arthritis. Some progression of symptoms and more wearing off.   Dx:  Parkinson's disease (HCC)    PLAN: - change carb/levo to Rytary 23.75/95 4 caps TID  - use walker - handicap tag form filled out  Return in about 3 months (around 02/18/2015).    Suanne Marker, MD 11/18/2014, 1:51 PM Certified in Neurology, Neurophysiology and Neuroimaging  [facility] [address] [place], [place] [address] [phone]

## 2014-11-20 ENCOUNTER — Other Ambulatory Visit: Payer: Self-pay | Admitting: Physician Assistant

## 2014-11-20 DIAGNOSIS — N2889 Other specified disorders of kidney and ureter: Secondary | ICD-10-CM

## 2014-11-20 DIAGNOSIS — K8689 Other specified diseases of pancreas: Secondary | ICD-10-CM

## 2014-11-20 DIAGNOSIS — K3189 Other diseases of stomach and duodenum: Secondary | ICD-10-CM

## 2014-11-20 DIAGNOSIS — R935 Abnormal findings on diagnostic imaging of other abdominal regions, including retroperitoneum: Secondary | ICD-10-CM

## 2014-11-24 ENCOUNTER — Telehealth: Payer: Self-pay | Admitting: Diagnostic Neuroimaging

## 2014-11-24 MED ORDER — CARBIDOPA-LEVODOPA ER 23.75-95 MG PO CPCR: 4.0000 | ORAL_CAPSULE | Freq: Three times a day (TID) | ORAL | Status: DC

## 2014-11-24 NOTE — Telephone Encounter (Signed)
Rx has been resent.  Receipt confirmed by pharmacy.   

## 2014-11-24 NOTE — Telephone Encounter (Signed)
Pt called and states that her RX for Carbidopa-Levodopa ER (RYTARY) 23.75-95 MG CPCR. Pt talked with [facility] and they are telling her they do not have Rx. Thank you

## 2014-12-08 ENCOUNTER — Other Ambulatory Visit: Payer: BLUE CROSS/BLUE SHIELD

## 2014-12-08 ENCOUNTER — Ambulatory Visit
Admission: RE | Admit: 2014-12-08 | Discharge: 2014-12-08 | Disposition: A | Discharge status: Home / Self Care | Payer: BLUE CROSS/BLUE SHIELD | Source: Ambulatory Visit | Attending: Physician Assistant | Admitting: Physician Assistant

## 2014-12-08 DIAGNOSIS — R935 Abnormal findings on diagnostic imaging of other abdominal regions, including retroperitoneum: Secondary | ICD-10-CM

## 2014-12-08 DIAGNOSIS — N2889 Other specified disorders of kidney and ureter: Secondary | ICD-10-CM

## 2014-12-08 DIAGNOSIS — K8689 Other specified diseases of pancreas: Secondary | ICD-10-CM

## 2014-12-08 DIAGNOSIS — K3189 Other diseases of stomach and duodenum: Secondary | ICD-10-CM

## 2014-12-08 MED ORDER — GADOBENATE DIMEGLUMINE 529 MG/ML IV SOLN
19.0000 mL | Freq: Once | INTRAVENOUS | Status: AC | PRN
  Administered 2014-12-08: 19 mL via INTRAVENOUS

## 2014-12-11 ENCOUNTER — Other Ambulatory Visit: Payer: Self-pay | Admitting: Gastroenterology

## 2014-12-12 ENCOUNTER — Emergency Department (HOSPITAL_COMMUNITY)
Admission: EM | Admit: 2014-12-12 | Discharge: 2014-12-12 | Disposition: A | Discharge status: Home / Self Care | Payer: BLUE CROSS/BLUE SHIELD | Attending: Emergency Medicine | Admitting: Emergency Medicine

## 2014-12-12 ENCOUNTER — Encounter (HOSPITAL_COMMUNITY): Payer: Self-pay

## 2014-12-12 DIAGNOSIS — K259 Gastric ulcer, unspecified as acute or chronic, without hemorrhage or perforation: Secondary | ICD-10-CM

## 2014-12-12 DIAGNOSIS — M069 Rheumatoid arthritis, unspecified: Secondary | ICD-10-CM | POA: Insufficient documentation

## 2014-12-12 DIAGNOSIS — R945 Abnormal results of liver function studies: Secondary | ICD-10-CM | POA: Diagnosis not present

## 2014-12-12 DIAGNOSIS — R748 Abnormal levels of other serum enzymes: Secondary | ICD-10-CM | POA: Diagnosis not present

## 2014-12-12 DIAGNOSIS — R1013 Epigastric pain: Secondary | ICD-10-CM | POA: Diagnosis present

## 2014-12-12 DIAGNOSIS — G20 Parkinson's disease: Secondary | ICD-10-CM | POA: Insufficient documentation

## 2014-12-12 DIAGNOSIS — I10 Essential (primary) hypertension: Secondary | ICD-10-CM | POA: Insufficient documentation

## 2014-12-12 DIAGNOSIS — R7989 Other specified abnormal findings of blood chemistry: Secondary | ICD-10-CM

## 2014-12-12 DIAGNOSIS — Z880 Allergy status to penicillin: Secondary | ICD-10-CM | POA: Diagnosis not present

## 2014-12-12 DIAGNOSIS — Z7952 Long term (current) use of systemic steroids: Secondary | ICD-10-CM | POA: Diagnosis not present

## 2014-12-12 DIAGNOSIS — Z79899 Other long term (current) drug therapy: Secondary | ICD-10-CM | POA: Insufficient documentation

## 2014-12-12 DIAGNOSIS — E7800 Pure hypercholesterolemia, unspecified: Secondary | ICD-10-CM | POA: Diagnosis not present

## 2014-12-12 DIAGNOSIS — Z8639 Personal history of other endocrine, nutritional and metabolic disease: Secondary | ICD-10-CM | POA: Diagnosis not present

## 2014-12-12 LAB — CBC WITH DIFFERENTIAL/PLATELET
BASOS ABS: 0 10*3/uL (ref 0.0–0.1)
BASOS PCT: 0 %
EOS PCT: 2 %
Eosinophils Absolute: 0.1 10*3/uL (ref 0.0–0.7)
HEMATOCRIT: 35.4 % — AB (ref 36.0–46.0)
Hemoglobin: 11.8 g/dL — ABNORMAL LOW (ref 12.0–15.0)
LYMPHS PCT: 41 %
Lymphs Abs: 3.6 10*3/uL (ref 0.7–4.0)
MCH: 29.1 pg (ref 26.0–34.0)
MCHC: 33.3 g/dL (ref 30.0–36.0)
MCV: 87.2 fL (ref 78.0–100.0)
MONO ABS: 0.6 10*3/uL (ref 0.1–1.0)
MONOS PCT: 6 %
Neutro Abs: 4.5 10*3/uL (ref 1.7–7.7)
Neutrophils Relative %: 51 %
PLATELETS: 266 10*3/uL (ref 150–400)
RBC: 4.06 MIL/uL (ref 3.87–5.11)
RDW: 13.2 % (ref 11.5–15.5)
WBC: 8.9 10*3/uL (ref 4.0–10.5)

## 2014-12-12 LAB — COMPREHENSIVE METABOLIC PANEL
ALBUMIN: 3.1 g/dL — AB (ref 3.5–5.0)
ALK PHOS: 82 U/L (ref 38–126)
ALT: 140 U/L — ABNORMAL HIGH (ref 14–54)
AST: 102 U/L — AB (ref 15–41)
Anion gap: 10 (ref 5–15)
BILIRUBIN TOTAL: 0.2 mg/dL — AB (ref 0.3–1.2)
BUN: 11 mg/dL (ref 6–20)
CO2: 26 mmol/L (ref 22–32)
Calcium: 9.5 mg/dL (ref 8.9–10.3)
Chloride: 104 mmol/L (ref 101–111)
Creatinine, Ser: 0.72 mg/dL (ref 0.44–1.00)
GFR calc Af Amer: >60 mL/min (ref >60)
GFR calc non Af Amer: >60 mL/min (ref >60)
GLUCOSE: 104 mg/dL — AB (ref 65–99)
Potassium: 3.8 mmol/L (ref 3.5–5.1)
SODIUM: 140 mmol/L (ref 135–145)
TOTAL PROTEIN: 6.1 g/dL — AB (ref 6.5–8.1)

## 2014-12-12 LAB — LIPASE, BLOOD: Lipase: 190 U/L — ABNORMAL HIGH (ref 11–51)

## 2014-12-12 MED ORDER — PANTOPRAZOLE SODIUM 40 MG PO TBEC
40.0000 mg | DELAYED_RELEASE_TABLET | Freq: Once | ORAL | Status: AC
  Administered 2014-12-12: 40 mg via ORAL
  Filled 2014-12-12: qty 1

## 2014-12-12 MED ORDER — GI COCKTAIL ~~LOC~~
30.0000 mL | Freq: Once | ORAL | Status: AC
  Administered 2014-12-12: 30 mL via ORAL
  Filled 2014-12-12: qty 30

## 2014-12-12 MED ORDER — PROMETHAZINE HCL 25 MG PO TABS: 25.0000 mg | ORAL_TABLET | Freq: Four times a day (QID) | ORAL | Status: AC | PRN

## 2014-12-12 MED ORDER — ONDANSETRON HCL 4 MG/2ML IJ SOLN
4.0000 mg | Freq: Once | INTRAMUSCULAR | Status: AC
  Administered 2014-12-12: 4 mg via INTRAVENOUS
  Filled 2014-12-12: qty 2

## 2014-12-12 MED ORDER — HYDROCODONE-ACETAMINOPHEN 5-325 MG PO TABS: 1.0000 | ORAL_TABLET | ORAL | Status: DC | PRN

## 2014-12-12 MED ORDER — SODIUM CHLORIDE 0.9 % IV SOLN
1000.0000 mL | Freq: Once | INTRAVENOUS | Status: AC
  Administered 2014-12-12: 1000 mL via INTRAVENOUS

## 2014-12-12 MED ORDER — ONDANSETRON HCL 4 MG PO TABS: 4.0000 mg | ORAL_TABLET | Freq: Four times a day (QID) | ORAL | Status: DC

## 2014-12-12 MED ORDER — MORPHINE SULFATE (PF) 4 MG/ML IV SOLN: 4.0000 mg | Freq: Once | INTRAVENOUS | Status: DC

## 2014-12-12 NOTE — ED Provider Notes (Signed)
CSN: [id number]     Arrival date & time 12/12/14  1624 History   First MD Initiated Contact with Patient 12/12/14 1708     Chief Complaint  Patient presents with  . Abdominal Pain     (Consider location/radiation/quality/duration/timing/severity/associated sxs/prior Treatment) Patient is a 59 y.o. female presenting with abdominal pain. The history is provided by the patient and medical records.  Abdominal Pain Pain location:  Epigastric Pain quality: aching, burning and cramping   Pain radiates to:  Back Pain severity:  Mild Onset quality:  Gradual Timing:  Intermittent Progression:  Waxing and waning Chronicity:  New Context: eating and recent illness   Context: not alcohol use, not diet changes, not previous surgeries, not sick contacts, not suspicious food intake and not trauma   Relieved by:  Nothing Worsened by:  Eating Ineffective treatments:  None tried Associated symptoms: nausea   Associated symptoms: no anorexia, no belching, no chest pain, no chills, no constipation, no cough, no diarrhea, no dysuria, no fatigue, no fever, no flatus, no hematemesis, no hematochezia, no hematuria, no melena, no shortness of breath, no sore throat, no vaginal bleeding, no vaginal discharge and no vomiting   Risk factors: has not had multiple surgeries     Past Medical History  Diagnosis Date  . Rheumatoid arthritis (HCC)   . Hypercholesterolemia   . Hypertension   . Multiple allergies   . Parkinson's disease (HCC)    History reviewed. No pertinent past surgical history. Family History  Problem Relation Age of Onset  . Heart disease Father   . Osteoporosis Father   . Hypertension Father   . Leukemia Father   . Diabetes Mother   . Arthritis Mother   . Asthma Mother   . Hypertension Mother   . Bursitis Mother   . Gout    . Brain cancer Paternal Aunt    Social History  Substance Use Topics  . Smoking status: Never Smoker   . Smokeless tobacco: Never Used  . Alcohol Use:  No   OB History    No data available     Review of Systems  Constitutional: Negative for fever, chills and fatigue.  HENT: Negative for facial swelling and sore throat.   Respiratory: Negative for cough and shortness of breath.   Cardiovascular: Negative for chest pain.  Gastrointestinal: Positive for nausea and abdominal pain. Negative for vomiting, diarrhea, constipation, melena, hematochezia, anorexia, flatus and hematemesis.  Genitourinary: Negative for dysuria, hematuria, vaginal bleeding and vaginal discharge.  Musculoskeletal: Negative for back pain.  Skin: Negative for rash.  Neurological: Negative for headaches.  Psychiatric/Behavioral: Negative for confusion.      Allergies  Codeine; Guaifenesin & derivatives; Other; Sulfa antibiotics; and Penicillins  Home Medications   Prior to Admission medications   Medication Sig Start Date End Date Taking? Authorizing Provider  ALPHA LIPOIC ACID PO Take 600 mg by mouth. 11/18/14 takes 3 tabs daily   Yes Historical Provider, MD  carbidopa-levodopa (SINEMET IR) 25-100 MG tablet Take 1 tablet by mouth 5 (five) times daily. 11/24/14  Yes Historical Provider, MD  Cholecalciferol (VITAMIN D-3) 1000 UNITS CAPS Take 1,000 Units by mouth.   Yes Historical Provider, MD  ciprofloxacin (CIPRO) 500 MG tablet Take 500 mg by mouth every 12 (twelve) hours. 12/03/14  Yes Historical Provider, MD  clobetasol cream (TEMOVATE) 0.05 % Apply 1 application topically daily.    Yes Historical Provider, MD  HUMIRA PEN 40 MG/0.8ML PNKT Inject 0.8 mLs into the muscle every 14 (fourteen)  days. Thursdays 05/01/14  Yes Historical Provider, MD  mirabegron ER (MYRBETRIQ) 50 MG TB24 tablet Take 50 mg by mouth at bedtime.   Yes Historical Provider, MD  Multiple Vitamins-Minerals (MULTIVITAMIN WITH MINERALS) tablet Take 1 tablet by mouth daily.   Yes Historical Provider, MD  omega-3 acid ethyl esters (LOVAZA) 1 G capsule Take 1 g by mouth 2 (two) times daily.    Yes  Historical Provider, MD  polyethylene glycol (MIRALAX / GLYCOLAX) packet Take 8.5 g by mouth daily.    Yes Historical Provider, MD  sertraline (ZOLOFT) 25 MG tablet Take 1 tablet (25 mg total) by mouth daily. 02/11/14  Yes Suanne Marker, MD  Carbidopa-Levodopa ER (RYTARY) 23.75-95 MG CPCR Take 4 capsules by mouth 3 (three) times daily. Patient not taking: Reported on 12/12/2014 11/24/14   Suanne Marker, MD  HYDROcodone-acetaminophen (NORCO/VICODIN) 5-325 MG tablet Take 1 tablet by mouth every 4 (four) hours as needed. 12/12/14   Gavin Pound, MD  ondansetron (ZOFRAN) 4 MG tablet Take 1 tablet (4 mg total) by mouth every 6 (six) hours. 12/12/14   Gavin Pound, MD  pantoprazole (PROTONIX) 40 MG tablet Take 40 mg by mouth 2 (two) times daily. 12/11/14   Historical Provider, MD  promethazine (PHENERGAN) 25 MG tablet Take 1 tablet (25 mg total) by mouth every 6 (six) hours as needed for nausea or vomiting. 12/12/14   Gavin Pound, MD   BP 119/49 mmHg  Pulse 72  Temp(Src) 98.2 F (36.8 C) (Oral)  Resp 16  Wt 210 lb (95.255 kg)  SpO2 97% Physical Exam  Constitutional: She is oriented to person, place, and time. She appears well-developed and well-nourished. No distress.  HENT:  Head: Normocephalic and atraumatic.  Right Ear: External ear normal.  Left Ear: External ear normal.  Nose: Nose normal.  Mouth/Throat: Oropharynx is clear and moist. No oropharyngeal exudate.  Eyes: Conjunctivae and EOM are normal. Pupils are equal, round, and reactive to light. Right eye exhibits no discharge. Left eye exhibits no discharge. No scleral icterus.  Neck: Normal range of motion. Neck supple. No JVD present. No tracheal deviation present. No thyromegaly present.  Cardiovascular: Normal rate, regular rhythm and intact distal pulses.   Pulmonary/Chest: Effort normal and breath sounds normal. No stridor. No respiratory distress. She has no wheezes. She has no rales. She exhibits no tenderness.   Abdominal: Soft. She exhibits no distension. There is tenderness in the epigastric area. There is no rigidity, no rebound, no guarding, no CVA tenderness, no tenderness at McBurney's point and negative Murphy's sign.  Mild epigastric TTP  Musculoskeletal: Normal range of motion. She exhibits no edema or tenderness.  Lymphadenopathy:    She has no cervical adenopathy.  Neurological: She is alert and oriented to person, place, and time.  Skin: Skin is warm and dry. No rash noted. She is not diaphoretic. No erythema. No pallor.  Psychiatric: She has a normal mood and affect. Her behavior is normal. Judgment and thought content normal.  Nursing note and vitals reviewed.   ED Course  Procedures (including critical care time) Labs Review Labs Reviewed  LIPASE, BLOOD - Abnormal; Notable for the following:    Lipase 190 (*)    All other components within normal limits  COMPREHENSIVE METABOLIC PANEL - Abnormal; Notable for the following:    Glucose, Bld 104 (*)    Total Protein 6.1 (*)    Albumin 3.1 (*)    AST 102 (*)    ALT 140 (*)  Total Bilirubin 0.2 (*)    All other components within normal limits  CBC WITH DIFFERENTIAL/PLATELET - Abnormal; Notable for the following:    Hemoglobin 11.8 (*)    HCT 35.4 (*)    All other components within normal limits    MDM   Final diagnoses:  Epigastric pain  Elevated LFTs  Elevated lipase  Gastric ulcer, unspecified chronicity    Patient presented for evaluation after being told to be evaluated in the ED by her gastroenterology office.  Patient had blood work obtained yesterday and had a recent EGD. Blood work showed signs of possible pancreatitis. Patient's laboratory workup was repeated today. Lipase appears to be improving. Patient's pain was controlled with GI cocktail. EGD yesterday showed an ulcer.  No concern for manipulation of the ampula of vater per GI physician (Dr. Bosie Clos).  Patient would prefer to be discharged home at this  time. Did discuss possible admission with GI. Patient feels well enough to go home, but admission possibility was discussed, as GI PA was initially concerned by lipase level.  As long as patient is tolerating by mouth and will stay on clear liquids until follow-up next week she can be managed as an outpatient. Patient is tolerating oral intake in the emergency department.  Patient was given return precautions for pancreatitis and PUD.  Pt advised on use of medications as applicable.  Advised to return for actely worsening symptoms, inability to take medications, or other acute concerns.  Advised to follow up with GI in 3 days.  Patient was in agreement with and expressed understanding of follow plan, plan of care, and return precautions.  All questions answered prior to discharge.  Patient was discharged in stable condition, ambulating without difficulty.  Patient care was discussed with my attending, Dr. Silverio Lay.   Gavin Pound, MD 12/14/14 2778  Gavin Pound, MD 12/14/14 2423  Richardean Canal, MD 12/15/14 [phone]

## 2014-12-12 NOTE — ED Notes (Signed)
Pt. Having abdominal pian with nausea , denies any v/d.  Pt. Had a MRI/MRA of her abdomen , chest and pelvis.  They recommended to have endoscopy which she had yesterday.   Shelly Rubenstein, PA from [place] called pt. Today and instructed her to come to the Ed due to abnormal labs.

## 2014-12-12 NOTE — ED Notes (Addendum)
Dr. Yao at the bedside. 

## 2014-12-12 NOTE — ED Notes (Signed)
Called dr. Silverio Lay, patient requests update in regards to labs, and wants to take her home meds now. MD acknowledges, allows patient to take home meds at this time.

## 2014-12-25 ENCOUNTER — Ambulatory Visit: Payer: BLUE CROSS/BLUE SHIELD | Admitting: Physical Therapy

## 2014-12-25 ENCOUNTER — Ambulatory Visit: Payer: BLUE CROSS/BLUE SHIELD

## 2014-12-25 ENCOUNTER — Ambulatory Visit: Payer: BLUE CROSS/BLUE SHIELD | Admitting: Occupational Therapy

## 2015-02-25 ENCOUNTER — Ambulatory Visit (INDEPENDENT_AMBULATORY_CARE_PROVIDER_SITE_OTHER): Payer: BLUE CROSS/BLUE SHIELD | Admitting: Diagnostic Neuroimaging

## 2015-02-25 ENCOUNTER — Encounter: Payer: Self-pay | Admitting: Diagnostic Neuroimaging

## 2015-02-25 VITALS — BP 116/68 | HR 68 | Ht 68.0 in | Wt 219.6 lb

## 2015-02-25 DIAGNOSIS — G20 Parkinson's disease: Secondary | ICD-10-CM | POA: Diagnosis not present

## 2015-02-25 DIAGNOSIS — G20A1 Parkinson's disease without dyskinesia, without mention of fluctuations: Secondary | ICD-10-CM

## 2015-02-25 MED ORDER — ROTIGOTINE 2 MG/24HR TD PT24: 1.0000 | MEDICATED_PATCH | Freq: Every day | TRANSDERMAL | Status: DC

## 2015-02-25 MED ORDER — CARBIDOPA-LEVODOPA 25-100 MG PO TABS: 1.0000 | ORAL_TABLET | Freq: Every day | ORAL | Status: DC

## 2015-02-25 NOTE — Patient Instructions (Addendum)
Thank you for coming to see [place] at [facility]. I hope we have been able to provide you high quality care today.  You may receive a patient satisfaction survey over the next few weeks. We would appreciate your feedback and comments so that we may continue to improve ourselves and the health of our patients.  - try neupro patch; change patch every day (neupro.com for discounts) - continue carb/levo - use rollator walker    ~~~~~~~~~~~~~~~~~~~~~~~~~~~~~~~~~~~~~~~~~~~~~~~~~~~~~~~~~~~~~~~~~  DR. PENUMALLI'S GUIDE TO HAPPY AND HEALTHY LIVING These are some of my general health and wellness recommendations. Some of them may apply to you better than others. Please use common sense as you try these suggestions and feel free to ask me any questions.   ACTIVITY/FITNESS Mental, social, emotional and physical stimulation are very important for brain and body health. Try learning a new activity (arts, music, language, sports, games).  Keep moving your body to the best of your abilities. You can do this at home, inside or outside, the park, community center, gym or anywhere you like. Consider a physical therapist or personal trainer to get started. Consider the app Sworkit. Fitness trackers such as smart-watches, smart-phones or Fitbits can help as well.   NUTRITION Eat more plants: colorful vegetables, nuts, seeds and berries.  Eat less sugar, salt, preservatives and processed foods.  Avoid toxins such as cigarettes and alcohol.  Drink water when you are thirsty. Warm water with a slice of lemon is an excellent morning drink to start the day.  Consider these websites for more information The Nutrition Source (https://www.henry-hernandez.biz/) [employer] Nutrition (WindowBlog.ch)   RELAXATION Consider practicing mindfulness meditation or other relaxation techniques such as deep breathing, prayer, yoga, tai chi, massage. See website  mindful.org or the apps Headspace or Calm to help get started.   SLEEP Try to get at least 7-8+ hours sleep per day. Regular exercise and reduced caffeine will help you sleep better. Practice good sleep hygeine techniques. See website sleep.org for more information.   PLANNING Prepare estate planning, living will, healthcare POA documents. Sometimes this is best planned with the help of an attorney. Theconversationproject.org and agingwithdignity.org are excellent resources.

## 2015-02-25 NOTE — Progress Notes (Signed)
[facility]  PATIENT: Laura Lawrence DOB: 14-Oct-1955  REFERRING CLINICIAN:  HISTORY FROM: patient and husband  REASON FOR VISIT: follow up   HISTORICAL  CHIEF COMPLAINT:  Chief Complaint  Patient presents with  . Parkinson's disease    rm 6, husband- Laura Lawrence, "tremors stronger now, harder to pronounce words, I slur more"  . Follow-up    HISTORY OF PRESENT ILLNESS:   UPDATE 02/25/15: Since last visit, tried rytary, and had more wearing off. Now back to regular carb/levo and back to baseline. She notes some dystonia in left foot. Kidney issue now felt to be a form of pyelonephritis. 4 morefalls since last visit.   UPDATE 11/18/14: Since last visit, continues on carb/levo 25/100 every 2.5 hours (5-6 tabs per day). Some wearing off after 2-3 hours. Also with new hematuria, and left renal cyst and pancreatic cyst, pending further workup. 2 more falls since last visit. Not using walker that much, but is planning to do so now. PT and OT helped back in March and April 2016.   UPDATE 05/20/14: Since last visit, doing well. Swallow testing per PCP showed primary esophageal dysphagia. No falls. Taking carb/levo 1 tab q3 hrs, 4-5 x per day, and feeling better. Headaches continue, mild tension type. No mood lability.   UPDATE 02/11/14: Since last visit patient reports increasing headaches, constipation, bladder incontinence, early morning nausea, unsteadiness and progressive hair loss. Patient tolerating carbidopa/levodopa. 2-3 months ago she stopped her sertraline, which she had been taking for 15 years for anxiety/depression and headache treatment.  UPDATE 10/10/13 (PS): Overall doing well. Had one fall since last visit, notes her feet got tied up and wouldn't move. Notes doing well with the Sinemet. Currently taking Sinemet 25-100 QID at 8am, noon, 4pm and 8pm. Denies any motor fluctuations, no wearing off noted. No dyskinesias. Happy with current control, feels it has made a  big difference. Sleeping well, minimal REM behavior disorder. Notes prior lab test showing elevated LFTs. Following up with rheumatology, they may consider tapering off of the methotrexate.   UPDATE 07/31/13 (PS): At last visit she had a MRI of the lumbar spine which showed some degenerative changes but no signs of compression. Continues to have difficulty with gait, notes episodes where she will start walking, "faster and faster" and not be able to stop until she falls over. Continues to have mild rest and postural tremor. Notes worsening handwriting, described as messy and smaller.   07/16/13 (PS):  At last visit she had a MRI of the brain and C spine both of which were overall unremarkable. Returns today with continued difficulty with her walking, feels very off balance, unsteady on her feet. Notes continued rest and postural/action tremor. No other acute issues at this time.   PRIOR HPI (06/17/13, Laura Lawrence): Laura Lawrence is a 60 y.o. female here as a referral from Dr. Julio Lawrence for tremors. Feels symptoms started around 2 months ago and has gotten progressively worse since then. Describes both a rest and action/postural tremor. Handwriting has gotten messier, no change in size. Notes some generalized slowing down of her movements. She feels off balance, having falls recently. Having difficulty with fine motor tasks. Notes some muscle cramps, aching in her legs. Tremor worse with stress/anxiety. No change with caffeine. Does not drink EtOH. No recent changes to her medications. Around Thanksgiving has noted loss of bladder control, states she will go without even knowing she was going. No family history of tremor. Has history of  HTN, no known TIA or strokes in the past.    REVIEW OF SYSTEMS: Full 14 system review of systems performed and notable only for incontinence weakness tremors freq urination joint pain walking diff.    ALLERGIES: Allergies  Allergen Reactions  . Codeine Hives  .  Guaifenesin & Derivatives Hives  . Nsaids     ulcers  . Other Diarrhea    Almonds  Almonds   . Sulfa Antibiotics Hives  . Penicillins Rash    HOME MEDICATIONS: Outpatient Prescriptions Prior to Visit  Medication Sig Dispense Refill  . ALPHA LIPOIC ACID PO Take 600 mg by mouth. 11/18/14 takes 3 tabs daily    . Cholecalciferol (VITAMIN D-3) 1000 UNITS CAPS Take 1,000 Units by mouth.    . clobetasol cream (TEMOVATE) 0.05 % Apply 1 application topically daily.     Marland Kitchen HUMIRA PEN 40 MG/0.8ML PNKT Inject 0.8 mLs into the muscle every 14 (fourteen) days. Thursdays  2  . HYDROcodone-acetaminophen (NORCO/VICODIN) 5-325 MG tablet Take 1 tablet by mouth every 4 (four) hours as needed. 20 tablet 0  . mirabegron ER (MYRBETRIQ) 50 MG TB24 tablet Take 50 mg by mouth at bedtime.    . Multiple Vitamins-Minerals (MULTIVITAMIN WITH MINERALS) tablet Take 1 tablet by mouth daily.    Marland Kitchen omega-3 acid ethyl esters (LOVAZA) 1 G capsule Take 1 g by mouth 2 (two) times daily.     . pantoprazole (PROTONIX) 40 MG tablet Take 40 mg by mouth 2 (two) times daily.  5  . polyethylene glycol (MIRALAX / GLYCOLAX) packet Take 8.5 g by mouth daily.     . promethazine (PHENERGAN) 25 MG tablet Take 1 tablet (25 mg total) by mouth every 6 (six) hours as needed for nausea or vomiting. 30 tablet 0  . sertraline (ZOLOFT) 25 MG tablet Take 1 tablet (25 mg total) by mouth daily. 30 tablet 12  . carbidopa-levodopa (SINEMET IR) 25-100 MG tablet Take 1 tablet by mouth 5 (five) times daily.  12  . Carbidopa-Levodopa ER (RYTARY) 23.75-95 MG CPCR Take 4 capsules by mouth 3 (three) times daily. (Patient not taking: Reported on 12/12/2014) 360 capsule 12  . ciprofloxacin (CIPRO) 500 MG tablet Take 500 mg by mouth every 12 (twelve) hours.  0  . ondansetron (ZOFRAN) 4 MG tablet Take 1 tablet (4 mg total) by mouth every 6 (six) hours. 12 tablet 0   No facility-administered medications prior to visit.    PAST MEDICAL HISTORY: Past Medical  History  Diagnosis Date  . Rheumatoid arthritis (HCC)   . Hypercholesterolemia   . Hypertension   . Multiple allergies   . Parkinson's disease (HCC)     PAST SURGICAL HISTORY: History reviewed. No pertinent past surgical history.  FAMILY HISTORY: Family History  Problem Relation Age of Onset  . Heart disease Father   . Osteoporosis Father   . Hypertension Father   . Leukemia Father   . Diabetes Mother   . Arthritis Mother   . Asthma Mother   . Hypertension Mother   . Bursitis Mother   . Gout    . Brain cancer Paternal Aunt     SOCIAL HISTORY:  Social History   Social History  . Marital Status: Married    Spouse Name: Laura Lawrence  . Number of Children: 1  . Years of Education: college   Occupational History  . self employed    Social History Main Topics  . Smoking status: Never Smoker   . Smokeless tobacco: Never Used  .  Alcohol Use: No  . Drug Use: No  . Sexual Activity: Not on file   Other Topics Concern  . Not on file   Social History Narrative   Patient is married Laura Lawrence), 2 children   Associates degree   Right handed   0-1 cups of coffee a day     PHYSICAL EXAM  Filed Vitals:   02/25/15 1322  BP: 116/68  Pulse: 68  Height: 5\' 8"  (1.727 m)  Weight: 219 lb 9.6 oz (99.61 kg)    Body mass index is 33.4 kg/(m^2).  No exam data present  No flowsheet data found.  GENERAL EXAM: Patient is in no distress; well developed, nourished and groomed; neck is supple  CARDIOVASCULAR: Regular rate and rhythm, no murmurs, no carotid bruits  NEUROLOGIC: MENTAL STATUS: awake, alert, language fluent, comprehension intact, naming intact, fund of knowledge appropriate; MASKED FACIES CRANIAL NERVE:  pupils equal and reactive to light, visual fields full to confrontation, extraocular muscles intact, no nystagmus, facial sensation and strength symmetric, hearing intact, palate elevates symmetrically, uvula midline, shoulder shrug symmetric, tongue midline;  MASKED FACIES MOTOR: normal bulk; MOD RIGIDITY IN LUE > RUE; RARE TREMOR IN LUE WITH CONTRALATERAL MOVEMENTS; MOD BRADYKINESIA IN BUE AND BLE (L SLOWER THAN R); full strength in the BUE, BLE SENSORY: normal and symmetric to light touch  COORDINATION: finger-nose-finger, fine finger movements normal REFLEXES: deep tendon reflexes present and symmetric GAIT/STATION: narrow based gait; DECR ARM SWING    DIAGNOSTIC DATA (LABS, IMAGING, TESTING) - I reviewed patient records, labs, notes, testing and imaging myself where available.  Lab Results  Component Value Date   WBC 8.9 12/12/2014   HGB 11.8* 12/12/2014   HCT 35.4* 12/12/2014   MCV 87.2 12/12/2014   PLT 266 12/12/2014      Component Value Date/Time   NA 140 12/12/2014 1834   K 3.8 12/12/2014 1834   CL 104 12/12/2014 1834   CO2 26 12/12/2014 1834   GLUCOSE 104* 12/12/2014 1834   BUN 11 12/12/2014 1834   CREATININE 0.72 12/12/2014 1834   CALCIUM 9.5 12/12/2014 1834   PROT 6.1* 12/12/2014 1834   ALBUMIN 3.1* 12/12/2014 1834   AST 102* 12/12/2014 1834   ALT 140* 12/12/2014 1834   ALKPHOS 82 12/12/2014 1834   BILITOT 0.2* 12/12/2014 1834   GFRNONAA >60 12/12/2014 1834   GFRAA >60 12/12/2014 1834   No results found for: CHOL, HDL, LDLCALC, LDLDIRECT, TRIG, CHOLHDL No results found for: AYTK1S Lab Results  Component Value Date   VITAMINB12 >1999* 07/16/2013   Lab Results  Component Value Date   TSH 2.320 07/16/2013     07/09/13 MRI brain (without) demonstrating: 1. Few (~4) punctate foci of periventricular and subcortical non-specific gliosis. 2. No acute findings.  07/09/13 MRI cervical spine (without) demonstrating mild disc bulging at C3-4 and C4-5. No spinal stenosis or foraminal narrowing. No intrinsic or compressive spinal cord lesions.  07/23/13 MRI lumbar spine (without) demonstrating: 1. At L4-5: disc bulging and facet and ligamentum flavum hypertrophy with moderate spinal stenosis and mild biforaminal stenosis   2. At L5-S1: disc bulging and facet arthropathy, with mild spinal stenosis, left lateral recess stenosis, mild right and moderate left foraminal stenosis  3. At L3-4: disc bulging, leftward disc protrusion, facet and ligamentum flavum hypertrophy with mild spinal stenosis and mild biforaminal stenosis      ASSESSMENT AND PLAN  60 y.o. year old female here with parkinson's disease, lumbar spinal stenosis, depression and rheumatoid arthritis. Some progression of  symptoms and more wearing off. Tried rytary, but not that effective.  Dx:  Parkinson's disease (HCC)    PLAN: - continue carb/levo 25/100 (1 tab 5x per day) - trial of neupro patch (2mg /24hr) - use walker - handicap tag form filled out - continue PT/OT/ST evaluations and treatements  Meds ordered this encounter  Medications  . rotigotine (NEUPRO) 2 MG/24HR    Sig: Place 1 patch onto the skin daily.    Dispense:  30 patch    Refill:  12  . carbidopa-levodopa (SINEMET IR) 25-100 MG tablet    Sig: Take 1 tablet by mouth 5 (five) times daily.    Dispense:  150 tablet    Refill:  12   Return in about 3 months (around 05/26/2015).    Suanne Marker, MD 02/25/2015, 1:46 PM Certified in Neurology, Neurophysiology and Neuroimaging  [facility] [address] [place], [place] [address] [phone]

## 2015-03-06 ENCOUNTER — Telehealth: Payer: Self-pay | Admitting: Diagnostic Neuroimaging

## 2015-03-06 NOTE — Telephone Encounter (Signed)
Unfortunately, we show no record of fax or call.  Ins has now been contacted and provided with clinical info, request is under review Ref # [id number]  [employer] [place] has approved the request for coverage on Neupro effective until 02/13/2038, or until the policy changes or is terminated.  Ins has notified the patient and we have notified the pharmacy.

## 2015-03-06 NOTE — Telephone Encounter (Signed)
Rosey Bath with [facility] in [place] calling regarding PA for rotigotine (NEUPRO) 2 MG/24HR . She said it was faxed on 02/25/15 and she called on the 03/03/15.

## 2015-03-26 ENCOUNTER — Ambulatory Visit: Payer: BLUE CROSS/BLUE SHIELD | Admitting: Occupational Therapy

## 2015-03-26 ENCOUNTER — Telehealth: Payer: Self-pay | Admitting: Diagnostic Neuroimaging

## 2015-03-26 ENCOUNTER — Other Ambulatory Visit: Payer: Self-pay | Admitting: *Deleted

## 2015-03-26 ENCOUNTER — Ambulatory Visit: Payer: BLUE CROSS/BLUE SHIELD | Attending: Internal Medicine | Admitting: Physical Therapy

## 2015-03-26 ENCOUNTER — Ambulatory Visit: Payer: BLUE CROSS/BLUE SHIELD

## 2015-03-26 ENCOUNTER — Other Ambulatory Visit: Payer: Self-pay | Admitting: Diagnostic Neuroimaging

## 2015-03-26 DIAGNOSIS — R471 Dysarthria and anarthria: Secondary | ICD-10-CM | POA: Insufficient documentation

## 2015-03-26 DIAGNOSIS — Z7409 Other reduced mobility: Secondary | ICD-10-CM

## 2015-03-26 DIAGNOSIS — R2689 Other abnormalities of gait and mobility: Secondary | ICD-10-CM

## 2015-03-26 DIAGNOSIS — R269 Unspecified abnormalities of gait and mobility: Secondary | ICD-10-CM | POA: Insufficient documentation

## 2015-03-26 DIAGNOSIS — R258 Other abnormal involuntary movements: Secondary | ICD-10-CM

## 2015-03-26 DIAGNOSIS — R4781 Slurred speech: Secondary | ICD-10-CM

## 2015-03-26 DIAGNOSIS — R296 Repeated falls: Secondary | ICD-10-CM

## 2015-03-26 NOTE — Therapy (Signed)
[facility] [address] [place], [place], [address] Phone: [phone]   Fax:  [phone]  Patient Details  Name: Laura Lawrence MRN: [id number] Date of Birth: 1955/02/27 Referring Provider:  Jackie Plum, MD  Encounter Date: 03/26/2015  Occupational Therapy Parkinson's Disease Screen  Physical Performance Test item #2 (simulated eating):  11.34sec  Physical Performance Test item #4 (donning/doffing jacket):  17.44sec  9-hole peg test:    RUE  31.35sec        LUE  26.37sec  Change in ability to perform ADLs/IADLs:  Writing smaller, harder to cut food with fork/knife, spills with eating, difficulty with fasteners, difficulty doffing jacket  Pt would benefit from occupational therapy evaluation due to  incr diffiuclty with ADLs.    [facility] 03/26/2015, 8:32 AM  [facility] [address] [place], [place], [address] Phone: [phone]   Fax:  [phone]

## 2015-03-26 NOTE — Telephone Encounter (Signed)
Pt called said rotigotine (NEUPRO) 2 MG/24HR is working well. She said the 1st week was a little rocky but the 3rd and 4th week is good. She said it is consistent, food taste really good, she has gained 6 lbs. Also she called [facility] to get refill for sertraline (ZOLOFT) 25 MG tablet and was told it needed provider authorization. [facility] called her a few moments ago and said RX has been discontinued per [employer]. But she is thinking the insurance is not going to cover it. She will call [facility] and find out.

## 2015-03-26 NOTE — Therapy (Signed)
[facility] [address] [place], [place], [address] Phone: [phone]   Fax:  [phone]  Patient Details  Name: Laura Lawrence MRN: [id number] Date of Birth: August 22, 1955 Referring Provider:  Jackie Plum, MD  Encounter Date: 03/26/2015 Speech Therapy Parkinson's Disease Screen   Decibel Level today: 71dB  (WNL=70-72 dB) with sound level meter 30cm away from pt's mouth. Pt's conversational volume is WNL. Pt reports more slurred speech, and a sluggish tongue when speaking (bites tongue regularly).  Pt has/has not experienced difficulty in swallowing warranting objective evaluation. Pt would benefit from speech-language eval for dysarthria. Please order via EPIC.  [facility] ,MS, CCC-SLP  03/26/2015, 8:07 AM  [facility] [address] [place], [place], [address] Phone: [phone]   Fax:  [phone]

## 2015-03-26 NOTE — Therapy (Signed)
[facility] [address] [place], [place], [address] Phone: [phone]   Fax:  [phone]  Patient Details  Name: Laura Lawrence MRN: [id number] Date of Birth: 07-30-1955 Referring Provider:  Jackie Plum, MD  Encounter Date: 03/26/2015  Physical Therapy Parkinson's Disease Screen   Timed Up and Go test:11.25 sec (uncontrolled descent into sitting)  10 meter walk test:9.27 sec (3.72 ft/sec)  5 time sit to stand test: unable to perform without UE support (takes 7.68 sec for first sit<>stand); (previous 17.04 sec 5x sit<>stand)  Patient would benefit from Physical Therapy evaluation due to slowed functional mobility including difficulty with transfers, new use of cane, pt reporting falls.  Will plan to follow up with Dr. Marjory Lies for PT order.     Laura Arriaga W. 03/26/2015, 8:19 AM  Laura Maidens., PT  [facility] [address] [place], [place], [address] Phone: [phone]   Fax:  [phone]

## 2015-03-27 NOTE — Telephone Encounter (Signed)
Spoke with patient who stated she discussed taking 25 mg sertraline instead of 50 mg with Dr Marjory Lies, and he agreed. She stated she has continued the medication at that dose. She is requesting refill, stated "I'm afraid my headaches will come back if I go off of it." She stated medication does not need pA, jst needs a refill sent in. E scribed refill to her pharmacy; she verbalized understanding, appreciation.

## 2015-04-06 ENCOUNTER — Other Ambulatory Visit: Payer: Self-pay | Admitting: *Deleted

## 2015-04-06 DIAGNOSIS — R2689 Other abnormalities of gait and mobility: Secondary | ICD-10-CM

## 2015-04-06 DIAGNOSIS — Z7409 Other reduced mobility: Secondary | ICD-10-CM

## 2015-04-06 DIAGNOSIS — Z789 Other specified health status: Secondary | ICD-10-CM

## 2015-04-06 DIAGNOSIS — R296 Repeated falls: Secondary | ICD-10-CM

## 2015-04-24 ENCOUNTER — Other Ambulatory Visit: Payer: Self-pay | Admitting: Diagnostic Neuroimaging

## 2015-04-29 ENCOUNTER — Ambulatory Visit: Payer: BLUE CROSS/BLUE SHIELD | Admitting: Occupational Therapy

## 2015-04-29 ENCOUNTER — Ambulatory Visit: Payer: BLUE CROSS/BLUE SHIELD | Attending: Diagnostic Neuroimaging | Admitting: Physical Therapy

## 2015-04-29 ENCOUNTER — Ambulatory Visit: Payer: BLUE CROSS/BLUE SHIELD

## 2015-04-29 DIAGNOSIS — R4701 Aphasia: Secondary | ICD-10-CM

## 2015-04-29 DIAGNOSIS — R471 Dysarthria and anarthria: Secondary | ICD-10-CM | POA: Diagnosis present

## 2015-04-29 DIAGNOSIS — R279 Unspecified lack of coordination: Secondary | ICD-10-CM | POA: Diagnosis present

## 2015-04-29 DIAGNOSIS — R29818 Other symptoms and signs involving the nervous system: Secondary | ICD-10-CM

## 2015-04-29 DIAGNOSIS — R258 Other abnormal involuntary movements: Secondary | ICD-10-CM | POA: Diagnosis not present

## 2015-04-29 DIAGNOSIS — R278 Other lack of coordination: Secondary | ICD-10-CM

## 2015-04-29 DIAGNOSIS — R293 Abnormal posture: Secondary | ICD-10-CM | POA: Diagnosis present

## 2015-04-29 DIAGNOSIS — R269 Unspecified abnormalities of gait and mobility: Secondary | ICD-10-CM | POA: Insufficient documentation

## 2015-04-29 DIAGNOSIS — Z7409 Other reduced mobility: Secondary | ICD-10-CM | POA: Insufficient documentation

## 2015-04-29 DIAGNOSIS — R29898 Other symptoms and signs involving the musculoskeletal system: Secondary | ICD-10-CM

## 2015-04-29 NOTE — Patient Instructions (Signed)
   Describing words  What group does it belong to?  What do I use it for?  Where can I find it?  What does it LOOK like?  What other words go with it?  What is the 1st sound of the word?  Many Ways to Communicate  Describe it Write it Draw it Gesture it Use related words  There's an App for that: Family Feud, Heads up, Stop-fun categories, What if, Conversation TherAPPy  GAMES to help with linguistic relationships -Taboo -Scrabble -Scattergories -Words with Friends (Scrabble app)  Provided by: Michiel Cowboy [phone]

## 2015-04-29 NOTE — Therapy (Addendum)
[facility] [address] [place], [place], [address] Phone: [phone]   Fax:  [phone]  Occupational Therapy Evaluation  Patient Details  Name: Laura Lawrence MRN: [id number] Date of Birth: [birth year] Referring Provider: Dr. Marjory Lies  Encounter Date: 04/29/2015    Past Medical History  Diagnosis Date  . Rheumatoid arthritis (HCC)   . Hypercholesterolemia   . Hypertension   . Multiple allergies   . Parkinson's disease (HCC)     No past surgical history on file.  There were no vitals filed for this visit.  Visit Diagnosis:  Bradykinesia - Plan: Ot plan of care cert/re-cert  Abnormality of gait - Plan: Ot plan of care cert/re-cert  Lack of coordination - Plan: Ot plan of care cert/re-cert  Impaired functional mobility and activity tolerance - Plan: Ot plan of care cert/re-cert  Abnormal posture - Plan: Ot plan of care cert/re-cert      Subjective Assessment - 04/29/15 1541    Subjective  Pt  with PD reports that she now has increased difficulty with dressing and moving in bed   Pertinent History see Epic    Patient Stated Goals increased ease with ADLs   Currently in Pain? Yes   Pain Location Hand   Pain Orientation Right;Left   Pain Descriptors / Indicators Aching   Pain Type Chronic pain   Pain Onset More than a month ago   Pain Frequency Intermittent   Aggravating Factors  overuse   Pain Relieving Factors heat   Multiple Pain Sites No           [facility] OT Assessment - 04/29/15 1545    Assessment   Diagnosis Parkinson's disease   Referring Provider Dr. Marjory Lies   Onset Date 04/06/15   Assessment Pt reports she has had a decline in ADL performance.   Prior Therapy OT/PT   Precautions   Precautions Fall   Precaution Comments 6-8 falls last 6 mons, no falls in last month   Balance Screen   Has the patient fallen in the past 6 months Yes   How many times? 6-8   Has the patient had a  decrease in activity level because of a fear of falling?  Yes   Is the patient reluctant to leave their home because of a fear of falling?  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   Leisure Enjoys painting   ADL   Eating/Feeding Independent  difficulty cutting food   Grooming Modified independent  difficulty brushing teeth   Upper Body Bathing Modified independent  difficulty washing hair   Lower Body Bathing Modified independent   Upper Body Dressing Increased time;Needs assist for fasteners  difficulty fastening bra, fastening buttons   Lower Body Dressing Increased time;Modified independent  using elastic waist pants   Toilet Tranfer Modified independent   Tub/Shower Transfer Supervision/safety  stepping in/ out of tub    ADL comments requires increased time with ADLS   IADL   Light Housekeeping Performs light daily tasks such as dishwashing, bed making   Meal Prep Able  to complete simple cold meal and snack prep   Mobility   Mobility Status History of falls;Independent   Written Expression   Dominant Hand Right   Handwriting Mild micrographia;100% legible  letter size decreases at end of sentence   Vision - History   Baseline Vision Wears glasses all the time   Patient Visual Report Other (comment)  blurry vision, has not seen opthamologist in about a year   Vision Assessment   Vision Assessment Vision not tested   Cognition   Overall Cognitive Status Impaired/Different from baseline   Memory Impaired   Memory Impairment Decreased recall of new  information   Observation/Other Assessments   Other Surveys  Select   Simulated Eating Time (seconds) 13.90 secs    Donning Doffing Jacket Time (seconds) 40.67   Donning Doffing Jacket Comments 3 button/ unbutton: 31.09 secs   Coordination   Gross Motor Movements are Fluid and Coordinated No   Fine Motor Movements are Fluid and Coordinated No   Right 9 Hole Peg Test 31.68 secs   Left 9 Hole Peg Test 37.10 secs   Box and Blocks R 47 blocks, LUE 41   Tremors only when late with meds   Other Diffiulty manipulating pegs in hand, dincreased difficulty with jewelry making   Coordination decreased left more affected than right   Tone   Assessment Location Right Upper Extremity;Left Upper Extremity   ROM / Strength   AROM / PROM / Strength AROM   AROM   Overall AROM  Deficits   AROM Assessment Site Shoulder;Elbow   Right/Left Shoulder Right;Left   Right Shoulder Flexion 110 Degrees   Left Shoulder Flexion 105 Degrees   Right/Left Elbow Right;Left   Right Elbow Extension -20   Left Elbow Extension -25   RUE Tone   RUE Tone Moderate   LUE Tone   LUE Tone Moderate                           OT Short Term Goals - 04/29/15 1655    OT SHORT TERM GOAL #1   Title Pt will be independent with HEP    Time 4   Period Weeks   Status New   OT SHORT TERM GOAL #2   Title Pt will reduce simluated dressing task on Physical Performance test #4 by 5 seconds    Baseline 40.67   Time 4   Period Weeks   Status New   OT SHORT TERM GOAL #3   Title Pt will verbalize understanding of adapted strategies for ADLS/IADLS.   Time 4   Period Weeks   Status New   OT SHORT TERM GOAL #4   Title Pt will verbalize understanding of compensatory strategies for short term memory deficits and ways to keep thinking skills sharp.   Time 4   Period Weeks   Status New   OT SHORT TERM GOAL #5   Title Pt will verbalize understanding of community resources and ways to prevent future PD related  complications.   Time 4   Period Weeks   Status New           OT Long Term Goals - 04/29/15 1657    OT LONG TERM GOAL #1   Title Pt will decrease time on 3 button/ unbutton task to 26 sec or less   Baseline 31.09   Time 8   Period Weeks   Status New   OT LONG TERM GOAL #  2   Title Pt will decrease 9 hole peg test by 3 seconds for LUE   Baseline 37.10   Time 8   Period Weeks   Status New   OT LONG TERM GOAL #3   Title Pt will decrease simulated dressing task on Physical Peformance test by 10 seconds    Baseline 40.67   Time 8   Period Weeks   OT LONG TERM GOAL #4   Title Pt will decrease simluated eating task on Physical Performance Test by 3 seconds    Baseline 13.90   Time 8   Period Weeks   Status New   OT LONG TERM GOAL #5   Title Pt will retrieve a lightweight object from overhead shelf with -15 elbow extension and pain no greater then 3/10 (for bilaterally UE's tested individually)   Baseline RUE elbow -20, LUE elbow -25   Time 8   Period Weeks   Status New            Plan - 04/29/15 1647    Clinical Impression Statement Pt with Parkinson's disease with history of rhematoid arthritis, lumbar spinal stenosis,and  depression presents to occupational therapy with a decline in ADL performance and multiple falls with in the last 6 months. Ptcan benefit from skilled occupational therapy to address the following: bradykinesia, decreased coordination,decreased A/ROM, rigitdity and decreased balance which impedes performance of ADLs/IADLS . Pt can benefi from skilled occupational therapy.   Rehab Potential Good   OT Frequency 2x / week  plus eval   OT Duration 8 weeks   OT Treatment/Interventions Self-care/ADL training;Therapeutic exercise;Functional Mobility Training;Patient/family education;Balance training;Splinting;Manual Therapy;Ultrasound;Neuromuscular education;Energy conservation;Therapeutic exercises;Therapeutic activities;DME and/or AE  instruction;Parrafin;Cryotherapy;Electrical Stimulation;Fluidtherapy;Gait Training;Scar mobilization;Cognitive remediation/compensation;Passive range of motion;Visual/perceptual remediation/compensation;Contrast Bath;Moist Heat   Plan initate HEP coordination/ PWR!, ADL strategies( fasten buttons, din jacket, cutting food)   Consulted and Agree with Plan of Care Patient        Problem List Patient Active Problem List   Diagnosis Date Noted  . Tremor 06/17/2013    Karinna Beadles 04/29/2015, 5:16 PM Keene Breath, OTR/L [phone] Phone: [phone] 5:17 PM 04/29/2015 [facility] [address] [place], [place], [address] Phone: [phone]   Fax:  [phone]  Name: Laura Lawrence MRN: [id number] Date of Birth: January 15, 1956

## 2015-04-30 NOTE — Therapy (Signed)
[facility] [address] [place], [place], [address] Phone: [phone]   Fax:  [phone]  Physical Therapy Evaluation  Patient Details  Name: Laura Lawrence MRN: [id number] Date of Birth: [birth year] Referring Provider: Marjory Lies  Encounter Date: 04/29/2015      PT End of Session - 04/30/15 2159    Visit Number 1   Number of Visits 17   Date for PT Re-Evaluation 06/28/15   Authorization Type [employer]-30 visit limit combined   PT Start Time 1404   PT Stop Time 1445   PT Time Calculation (min) 41 min   Activity Tolerance Patient tolerated treatment well   Behavior During Therapy [facility] for tasks assessed/performed      Past Medical History  Diagnosis Date  . Rheumatoid arthritis (HCC)   . Hypercholesterolemia   . Hypertension   . Multiple allergies   . Parkinson's disease (HCC)     No past surgical history on file.  There were no vitals filed for this visit.  Visit Diagnosis:  Bradykinesia  Abnormality of gait  Abnormal posture  Postural instability      Subjective Assessment - 04/30/15 2154    Subjective Pt is a 60 year old female who presents to OP  PT with history of Parkinson's disease.  Reports multiple falls in the past 6 months, only one fall in the past month.  She notes some changes in PD medications (addition of Neupro) in the past  month, with improvements noted since.  Pt reports at least  8 falls in the past months  Typically, she reports falls occur with turning.  She needs assistance for getting up from the floor.  She is using cane for mobility   Pertinent History History of Rheumatoid Arthritis   Patient Stated Goals Pt's goal for therapy is to improve transitional movements-floor, transfers, bed mobility.   Currently in Pain? No/denies            [facility] PT Assessment - 04/30/15 2155    Assessment   Medical Diagnosis Parkinson's disease   Onset Date/Surgical Date 03/26/15   Interdisciplinary PD screen   Precautions   Precautions Fall  Hx of RA   Balance Screen   Has the patient fallen in the past 6 months Yes   How many times? 8   Has the patient had a decrease in activity level because of a fear of falling?  Yes   Is the patient reluctant to leave their home because of a fear of falling?  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 difficulty with turning, rolling, scooting   Transfers   Transfers Sit to Stand;Stand to Sit   Sit to Stand 6: Modified independent (Device/Increase time);With upper extremity assist;From chair/3-in-1   Five time sit to stand comments  unable without UE support   Stand to Sit 6: Modified independent (Device/Increase time);With upper extremity assist;To chair/3-in-1;Uncontrolled descent   Ambulation/Gait   Ambulation/Gait Yes   Ambulation/Gait Assistance 5: Supervision   Ambulation/Gait Assistance  Details Trial of 4-wheeled RW (with increased gait speed, slightly increased forward flexed posture); U-step RW (pt reported and PT noted slowed, more controlled gait)   Ambulation Distance (Feet) 100 Feet  x 3   Assistive device None;Other (Comment)  Trialed rollator, U-step RW   Gait Pattern Poor foot clearance - left;Poor foot clearance - right;Step-through pattern;Decreased arm swing - right;Decreased arm swing - left;Decreased step length - right;Decreased step length - left;Decreased dorsiflexion - right;Decreased dorsiflexion - left  Hastening with gait, forward flexed   Ambulation Surface Level;Indoor   Gait velocity 12.95 sec with cane (2.53 ft/sec); with walker 11.42 sec (2.87 ft/sec   Standardized Balance Assessment   Standardized Balance Assessment Timed Up and Go Test   Timed Up and Go Test   Normal TUG (seconds) 14.65   Manual TUG (seconds) 19.09   Cognitive TUG (seconds) 14.35   TUG Comments uncontrolled turn to sit  some hastening forward with gait   High Level Balance   High Level Balance Comments Posterior push and release test:  Pt takes 2 small steps to regain balance, but steps delayed, therapist assist to prevent LOB.                           PT Education - 04/30/15 2158    Education provided Yes   Education Details Discussed possibilities for assistive device for improved safety with gait; trial of 4-wheeled RW and U-step RW, discussed needing further trial prior to pt purchasing 4-wheeled RW-pt agrees to hold off on that purchase for now   [employer]) Educated Patient   Methods Explanation;Demonstration   Comprehension Verbalized understanding          PT Short Term Goals - 04/30/15 2208    PT SHORT TERM GOAL #1   Title Pt will be independent with HEP for improved balance, transfers, posture, gait.  TARGET 05/29/15   Time 4   Period Weeks   Status New   PT SHORT TERM GOAL #2   Title Pt will be able to perform 6 of 10 reps of sit<>stand  transfers with minimal UE support, for improved safety and efficiency with transfers.   Time 4   Period Weeks   Status New   PT SHORT TERM GOAL #3   Title Pt will improve TUG socre to less than or equal to 13.5 seconds for decreased fall risk.   Time 4   Period Weeks   Status New   PT SHORT TERM GOAL #4   Title Pt will verbalize/demonstrate tips to reduce freezing episodes/falls with gait and turns.   Time 4   Period Weeks   Status New   PT SHORT TERM GOAL #5   Title Pt will ambulate at least 500 ft using least restrictive assistive device, with supervision for improved safety and efficiency with gait.   Time 4  Period Weeks   Status New           PT Long Term Goals - 04/30/15 2210    PT LONG TERM GOAL #1   Title Pt will report at least 25% improvement in bed mobility.  TARGET 06/28/15   Time 8   Period Weeks   Status New   PT LONG TERM GOAL #2   Title Pt will improve TUG manual score to less than or equal to 15 seconds for decreased fall risk.   Time 8   Period Weeks   Status New   PT LONG TERM GOAL #3   Title Pt will improve gait velocity to at least 2.62 ft/sec with appropriate assistive device for improved gait efficiency and safety.   Time 8   Period Weeks   Status New   PT LONG TERM GOAL #4   Title Pt will perform floor>stand transfer with UE support with minimal assistance for improved fall recovery.   Time 8   Period Weeks   Status New               Plan - 04/30/15 2201    Clinical Impression Statement Pt is a 60 year old female with history of Parkinson's disease, who presents to OP PT today with decreased coordination and timing of gait, decreased balance, postural instability, decreased functional strength, bradykinesia, stiffness of lower extremities, decreased independent transfers, with reprots of difficulty with bed mobility, turning with gait and getting up from the floor when falling.  Pt has had at least 8 falls in the past 6 months.  She has  at least 3 other co-morbidities, and her presentation is evolving due to her high number of falls, recent speech changes.  Pt is at high fall risk per TUG score and demonstrates difficulty with dual tasking with manual TUG.  Pt demonstrates decreased gait speed.  Pt will benefit from skilled physical therapy to address the above stated deficits to reduce fall risk and to improve functional mobility.   Pt will benefit from skilled therapeutic intervention in order to improve on the following deficits Abnormal gait;Decreased balance;Decreased mobility;Decreased knowledge of use of DME;Decreased strength;Difficulty walking;Postural dysfunction   Rehab Potential Good   PT Frequency 2x / week   PT Duration 8 weeks  plus eval; may change due to visit limitations   PT Treatment/Interventions ADLs/Self Care Home Management;Therapeutic exercise;Therapeutic activities;Functional mobility training;Gait training;DME Instruction;Balance training;Neuromuscular re-education;Patient/family education   PT Next Visit Plan Transfer training for improved safety with transfers; gait trials with 4-wheeled rollator versus U-step RW; turning practice; complete Berg balance test   Consulted and Agree with Plan of Care Patient         Problem List Patient Active Problem List   Diagnosis Date Noted  . Tremor 06/17/2013    Alistar Mcenery W. 04/30/2015, 10:19 PM  Gean Maidens., PT  [facility] [address] [place], [place], [address] Phone: [phone]   Fax:  [phone]  Name: KENDAL JERVEY MRN: [id number] Date of Birth: 07/09/1955

## 2015-04-30 NOTE — Therapy (Signed)
[facility] [address] [place], [place], [address] Phone: [phone]   Fax:  [phone]  Speech Language Pathology Evaluation  Patient Details  Name: Laura Lawrence MRN: [id number] Date of Birth: Nov 17, 1955 Referring Provider: Dr. Marjory Lies  Encounter Date: 04/29/2015      End of Session - 04/30/15 0900    Visit Number 1   Number of Visits 16   Date for SLP Re-Evaluation 07/06/15   SLP Start Time 1448   SLP Stop Time  1530   SLP Time Calculation (min) 42 min   Activity Tolerance Patient tolerated treatment well      Past Medical History  Diagnosis Date  . Rheumatoid arthritis (HCC)   . Hypercholesterolemia   . Hypertension   . Multiple allergies   . Parkinson's disease (HCC)     No past surgical history on file.  There were no vitals filed for this visit.  Visit Diagnosis: Dysarthria - Plan: SLP plan of care cert/re-cert  Aphasia - Plan: SLP plan of care cert/re-cert      Subjective Assessment - 04/29/15 1453    Subjective Pt reports her frequency of "slurring speech" has incr'd. Pt reports feeling better under control with walking with new medicine (patch). Language formulation is better when less fatigued.    Currently in Pain? No/denies            SLP Evaluation [facility] - 04/29/15 1453    SLP Visit Information   SLP Received On 04/29/15   Referring Provider Dr. Marjory Lies   Medical Diagnosis Parkinson's Disease   General Information   HPI Pt is taking lunch later in the day, pacing self to do less early in the day to assist in maintaining stamina.    Prior Functional Status   Cognitive/Linguistic Baseline Within functional limits   Cognition   Overall Cognitive Status Within Functional Limits for tasks assessed   Auditory Comprehension   Overall Auditory Comprehension Appears within functional limits for tasks assessed   Verbal Expression   Overall Verbal Expression Impaired   Reportedly, later in day   Naming Impairment   Confrontation --  [address] Naming Test   Divergent --  average 7 items/60 seconds for simple categories   Interfering Components --  fatigue, according to pt   Other Verbal Expression Comments Pt reports her language formulation skill decreases in that speech is more hesitant and  pausing, taking pt a longer amount of time to complete a thought. Anomia increases during these times.   Oral Motor/Sensory Function   Overall Oral Motor/Sensory Function Impaired   Labial ROM Within Functional Limits   Labial Strength Reduced   Labial Coordination Reduced  on alternate motion tasks   Lingual ROM Within Functional Limits   Lingual Symmetry Within Functional Limits   Lingual Strength Within Functional Limits   Lingual Coordination Reduced  on alternate motion tasks   Velum --  sluggish/slow movement with /a/ repeated   Motor Speech   Articulation Impaired   Level of Impairment Phrase   Intelligibility Intelligibility reduced   Phrase --  >98%   Sentence --  >98%   Conversation --  >95%   Motor Planning Impaired   Level of Impairment Phrase   Motor Speech Errors Aware;Consistent   Effective Techniques Over-articulate;Slow rate   Standardized Assessments   Standardized Assessments  [place] Naming Test-2nd edition   [place] Naming Test-2nd edition  56/60 - WNL  SLP Education - 04/29/15 1502    Education provided Yes   Education Details further compensations for speech intelligibility, and for language formation   Person(s) Educated Patient   Methods Explanation   Comprehension Verbalized understanding;Returned demonstration          SLP Short Term Goals - 04/30/15 0905    SLP SHORT TERM GOAL #1   Title pt will complete HEP with rare min A   Time 4   Period Weeks   Status New   SLP SHORT TERM GOAL #2   Title pt will report 25% (subjective) improvement with articulation/speech  production in late afternoon/early evenings   Time 4   Period Weeks   Status New   SLP SHORT TERM GOAL #3   Title pt will demo compensations for dysarthria in 10 minutes simple conversation with rare min A   Time 4   Status New   SLP SHORT TERM GOAL #4   Title pt will demo compensations for aphasia/verbal expression in 10 minutes simple conversation PRN   Time 4   Period Weeks   Status New   SLP SHORT TERM GOAL #5   Title pt will complete mod complex naming tasks with 85% success   Time 4   Period Weeks   Status New          SLP Long Term Goals - 04/30/15 0908    SLP LONG TERM GOAL #1   Title pt will complete HEP indpeendently   Time 8   Status New   SLP LONG TERM GOAL #2   Title pt to demo compensations for verbal expression in 10 minutes mod complex conversation/work-like verbal expression independently   Time 8   Period Weeks   Status New   SLP LONG TERM GOAL #3   Title pt will demo compensations for dysarthria in 10 minutes mod complex convesation/work like verbal expression independently   Time 8   Period Weeks   Status New   SLP LONG TERM GOAL #4   Title pt will report (subjectively) an improvement of at least 50% with articulation/speech production later in the day   Time 8   Period Weeks   Status New          Plan - 04/30/15 0901    Clinical Impression Statement Pt presents today with dysarthria c/b impreicise consonants reducing speech intelligibility, especially in the late afternoon or early evening as pt's fatigue increases. Pt's success with alternate motion tasks with articulators were incoordinated. Additionally, pt reports more hesitant speech later in the day, indicative of verbal expression deficits exacerbated with fatigue. Indeed, pt's performance with divergent naming appeared lacking with simple categories. Pt would benefit from skilled ST to improve speech intelligibility by improving muscle movement for speech.    Speech Therapy Frequency 2x /  week   Duration --  8 weeks   Treatment/Interventions SLP instruction and feedback;Compensatory strategies;Patient/family education;Functional tasks;Cueing hierarchy  HEP   Potential to Achieve Goals Good        Problem List Patient Active Problem List   Diagnosis Date Noted  . Tremor 06/17/2013    [facility] ,MS, CCC-SLP  04/30/2015, 9:27 AM  [facility] [address] [place], [place], [address] Phone: [phone]   Fax:  [phone]  Name: DIASIA HENKEN MRN: [id number] Date of Birth: 03-25-1955

## 2015-05-12 ENCOUNTER — Telehealth: Payer: Self-pay | Admitting: Diagnostic Neuroimaging

## 2015-05-12 NOTE — Telephone Encounter (Signed)
LVM requesting call back.

## 2015-05-12 NOTE — Telephone Encounter (Signed)
Pt started rotigotine (NEUPRO) 2 MG/24HR and has been experiencing some side effects: feet and leg swelling, peripheral edema , one fall. She wants to know about any other medication that may not have these side effects. Does she need to stop using it or continue till her next appt. Would like to talk about a different walker. May call

## 2015-05-13 ENCOUNTER — Ambulatory Visit: Payer: BLUE CROSS/BLUE SHIELD | Admitting: Occupational Therapy

## 2015-05-13 ENCOUNTER — Ambulatory Visit: Payer: BLUE CROSS/BLUE SHIELD | Admitting: Physical Therapy

## 2015-05-13 ENCOUNTER — Ambulatory Visit: Payer: BLUE CROSS/BLUE SHIELD

## 2015-05-13 NOTE — Telephone Encounter (Signed)
Per Dr Marjory Lies, spoke with patient and advised her to stop Neupro patch, wait 1-2 weeks for side effects to resolve. He may then restart Neupro at lower dose. Informed her that he will not prescribe a different medication at this time. Patient has FU in rwo weeks, and she stated she will wait until that time to discuss with dr. She verbalized understanding, appreciation for call back.

## 2015-05-13 NOTE — Telephone Encounter (Signed)
Spoke with patient who stated after 3 weeks of using Neupro patch her legs/feet began to swell. She stated that the swelling would go down during the night at first, but now she wakes with her feet still swollen. She stated that she "has been very happy with the medication otherwise. Her tremors are gone." She is asking if there is an alternative medication that would not have those side effects. Confirmed her pharmacy. Offered to move her FU earlier but she declined, stating she has rehab twice a week and is out of town two other days a week. Her FU is 05/27/15. Informed her would route her request to Dr Marjory Lies and call her back. She verbalized understanding, appreciation.

## 2015-05-13 NOTE — Telephone Encounter (Signed)
She should stop neupro x 1-2 weeks and make sure that side effect stops. Then we could restart medication at lower dose (1mg  patch). No other med recommended at this time. -VRP

## 2015-05-13 NOTE — Telephone Encounter (Signed)
Patient is returning your call.  

## 2015-05-19 ENCOUNTER — Ambulatory Visit: Payer: BLUE CROSS/BLUE SHIELD | Admitting: Occupational Therapy

## 2015-05-19 ENCOUNTER — Ambulatory Visit: Payer: BLUE CROSS/BLUE SHIELD | Admitting: Speech Pathology

## 2015-05-19 ENCOUNTER — Ambulatory Visit: Payer: BLUE CROSS/BLUE SHIELD | Attending: Diagnostic Neuroimaging | Admitting: Physical Therapy

## 2015-05-19 DIAGNOSIS — R471 Dysarthria and anarthria: Secondary | ICD-10-CM

## 2015-05-19 DIAGNOSIS — R293 Abnormal posture: Secondary | ICD-10-CM

## 2015-05-19 DIAGNOSIS — R269 Unspecified abnormalities of gait and mobility: Secondary | ICD-10-CM | POA: Diagnosis not present

## 2015-05-19 DIAGNOSIS — R2689 Other abnormalities of gait and mobility: Secondary | ICD-10-CM | POA: Diagnosis present

## 2015-05-19 DIAGNOSIS — R29898 Other symptoms and signs involving the musculoskeletal system: Secondary | ICD-10-CM | POA: Insufficient documentation

## 2015-05-19 DIAGNOSIS — R4701 Aphasia: Secondary | ICD-10-CM

## 2015-05-19 DIAGNOSIS — R29818 Other symptoms and signs involving the nervous system: Secondary | ICD-10-CM | POA: Insufficient documentation

## 2015-05-19 DIAGNOSIS — R278 Other lack of coordination: Secondary | ICD-10-CM | POA: Diagnosis present

## 2015-05-19 NOTE — Patient Instructions (Signed)
   SLOW LOUD OVER-ENNUNCIATE PAUSE   BUTTERCUP  CATERPILLAR  BASEBALLL PLAYER  [place]  [place]  SLOW AND BIG - EXAGGERATE YOUR MOUTH, MAKE EACH CONSONANT   The sick sixth Sheik's sixth sheep's sick  The instinct of an extinct insect stinks  Which wrist watches are [place] wrist watches?  Imagine managing the manager at an imaginary menagerie  Not many an anemone is enamored of an enemy anemone.  If a noisy noise annoys an onion, an annoying noisy noise annoys an onion more  Knapsack strap snap.  A bragging baker baked black bread  Short soldiers should shoot sufficiently straight  She should shun the shining sun  Three thugs thrushed thoughtfully through thickness  A three-toed tree toad loved a two-toed he-toad that lived in a too-tall tree

## 2015-05-19 NOTE — Therapy (Addendum)
[facility] [address] [place], [place], [address] Phone: [phone]   Fax:  [phone]  Speech Language Pathology Treatment  Patient Details  Name: Laura Lawrence MRN: [id number] Date of Birth: [birth year] Referring Provider: Dr. Leta Baptist  Encounter Date: 05/19/2015    Past Medical History  Diagnosis Date  . Rheumatoid arthritis ([place])   . Hypercholesterolemia   . Hypertension   . Multiple allergies   . Parkinson's disease ([place])     No past surgical history on file.  There were no vitals filed for this visit.  Visit Diagnosis: Dysarthria and anarthria  Aphasia                 SLP Short Term Goals - 05/28/15 1110    SLP SHORT TERM GOAL #1   Title pt will complete HEP with rare min A   Time 3   Period Weeks   SLP SHORT TERM GOAL #2   Title pt will report 25% (subjective) improvement with articulation/speech production in late afternoon/early evenings   Time 3   Period Weeks   Status Partially Met   SLP SHORT TERM GOAL #3   Title pt will demo compensations for dysarthria in 10 minutes simple conversation with rare min A   Time 3   Status Partially Met   SLP SHORT TERM GOAL #4   Title pt will demo compensations for aphasia/verbal expression in 10 minutes simple conversation PRN   Time 3   Status Partially Met   SLP SHORT TERM GOAL #5   Title pt will complete mod complex naming tasks with 85% success   Time 3   Status Partially Met          SLP Long Term Goals - 05/28/15 1112    SLP LONG TERM GOAL #1   Title pt will complete HEP indpeendently   Time 7   SLP LONG TERM GOAL #2   Title pt to demo compensations for verbal expression in 10 minutes mod complex conversation/work-like verbal expression independently   Time 7   SLP LONG TERM GOAL #3   Title pt will demo compensations for dysarthria in 10 minutes mod complex convesation/work like verbal expression independently   Time 77    SLP LONG TERM GOAL #4   Title pt will report (subjectively) an improvement of at least 50% with articulation/speech production later in the day   Time 7          Problem List Patient Active Problem List   Diagnosis Date Noted  . Tremor 06/17/2013    Lovvorn, Annye Rusk MS, CCC-SLP 06/04/2015, 11:40 AM  [facility] [address] [place], [place], [address] Phone: [phone]   Fax:  [phone]   Name: ANH BIGOS MRN: [id number] Date of Birth: [birth year]

## 2015-05-19 NOTE — Patient Instructions (Signed)
PWR! Hands  With arms stretched out in front of you (elbows straight), perform the following:  PWR! Rock: Move wrists up and down [employer]! Twist: Twist palms up and down BIG  Then, start with elbows bent and hands closed.  PWR! Step: Touch index finger to thumb while keeping other fingers straight. Flick fingers out BIG (thumb out/straighten fingers). Repeat with other fingers. (Step your thumb to each finger).  PWR! Hands: Push hands out BIG. Elbows straight, wrists up, fingers open and spread apart BIG. (Can also perform by pushing down on table, chair, knees. Push above head, out to the side, behind you, in front of you.)   ** Make each movement big and deliberate so that you feel the movement.  Perform at least 10 repetitions 1x/day, but perform PWR! hands throughout the day when you are having trouble using your hands (picking up/manipulating small objects, writing, eating, typing, sewing, buttoning, etc.).   Coordination Exercises  Perform the following exercises for 20 minutes 1 times per day. Perform with both hand(s). Perform using big movements.   Flipping Cards: Place deck of cards on the table. Flip cards over by opening your hand big to grasp and then turn your palm up big, opening hand fully to release.  Deal cards: Hold 1/2 or whole deck in your hand. Use thumb to push card off top of deck with one big push.  Rotate ball with fingertips: Pick up with fingers/thumb and move as much as you can with each turn/movement (clockwise and counter-clockwise).  Toss ball from one hand to the other: Toss big/high.  Deliberately open with toss and deliberately close hand after catch.  Toss ball in the air and catch with the same hand: Toss big/high.  Deliberately open with toss and deliberately close hand after catch.  Rotate 2 golf balls in your hand: Both directions.  Pick up coins and stack one at a time: Pick up with big, intentional movements. Do not drag coin to the  edge. (5-10 in a stack)  Pick up 5-10 coins one at a time and hold in palm. Then, move coins from palm to fingertips one at time and place in coin bank/container.  Practice writing: Slow down, write big, and focus on forming each letter.  Practice typing.  Fasten nuts/bolts or put on bottle caps: Turn as much/as big as you can with each turn.  Move wrist.

## 2015-05-19 NOTE — Therapy (Signed)
[facility] [address] [place], [place], [address] Phone: [phone]   Fax:  [phone]  Occupational Therapy Treatment  Patient Details  Name: Laura Lawrence MRN: [id number] Date of Birth: [birth year] Referring Provider: Dr. Marjory Lies  Encounter Date: 05/19/2015      OT End of Session - 05/19/15 1500    Visit Number 2   Number of Visits 17   Date for OT Re-Evaluation 06/29/15   Authorization Type BCBS   OT Start Time 1422   OT Stop Time 1505   OT Time Calculation (min) 43 min   Activity Tolerance Patient tolerated treatment well   Behavior During Therapy [facility] for tasks assessed/performed      Past Medical History  Diagnosis Date  . Rheumatoid arthritis ([facility])   . Hypercholesterolemia   . Hypertension   . Multiple allergies   . Parkinson's disease ([facility])     No past surgical history on file.  There were no vitals filed for this visit.  Visit Diagnosis:  Other lack of coordination  Other symptoms and signs involving the musculoskeletal system  Other symptoms and signs involving the nervous system  Abnormal posture      Subjective Assessment - 05/19/15 1633    Subjective  "It just doesn't want to move"   Pertinent History see Epic    Patient Stated Goals increased ease with ADLs   Currently in Pain? No/denies                              OT Education - 05/19/15 1458    Education provided Yes   Education Details PWR! Hands (basic 4) HEP; Coordination HEP for large amplitude   Person(s) Educated Patient   Methods Explanation;Demonstration;Verbal cues;Handout;Tactile cues   Comprehension Verbalized understanding;Returned demonstration;Verbal cues required  Mod v.c. for larger amplitude movements          OT Short Term Goals - 04/29/15 1655    OT SHORT TERM GOAL #1   Title Pt will be independent with HEP    Time 4   Period Weeks   Status New   OT SHORT TERM GOAL #2    Title Pt will reduce simluated dressing task on Physical Performance test #4 by 5 seconds    Baseline 40.67   Time 4   Period Weeks   Status New   OT SHORT TERM GOAL #3   Title Pt will verbalize understanding of adapted strategies for ADLS/IADLS.   Time 4   Period Weeks   Status New   OT SHORT TERM GOAL #4   Title Pt will verbalize understanding of compensatory strategies for short term memory deficits and ways to keep thinking skills sharp.   Time 4   Period Weeks   Status New   OT SHORT TERM GOAL #5   Title Pt will verbalize understanding of community resources and ways to prevent future PD related complications.   Time 4   Period Weeks   Status New           OT Long Term Goals - 04/29/15 1657    OT LONG TERM GOAL #1   Title Pt will decrease time on 3 button/ unbutton task to 26 sec or less   Baseline 31.09   Time 8   Period Weeks   Status New   OT LONG TERM GOAL #2   Title Pt will decrease 9 hole peg test by 3  seconds for LUE   Baseline 37.10   Time 8   Period Weeks   Status New   OT LONG TERM GOAL #3   Title Pt will decrease simulated dressing task on Physical Peformance test by 10 seconds    Baseline 40.67   Time 8   Period Weeks   OT LONG TERM GOAL #4   Title Pt will decrease simluated eating task on Physical Performance Test by 3 seconds    Baseline 13.90   Time 8   Period Weeks   Status New   OT LONG TERM GOAL #5   Title Pt will retrieve a lightweight object from overhead shelf with -15 elbow extension and pain no greater then 3/10 (for bilaterally UE's tested individually)   Baseline RUE elbow -20, LUE elbow -25   Time 8   Period Weeks   Status New   OT LONG TERM GOAL #6   Title --------------------------------------------------------------------------------------------------------------------------   Status New               Plan - 05/19/15 1642    Clinical Impression Statement Pt responded to cueing for large amplitude movements and  incr effort.   Plan initiate PWR! HEP in sitting, ADL strategies (fasten buttons, don jacket, cutting food)   Consulted and Agree with Plan of Care Patient        Problem List Patient Active Problem List   Diagnosis Date Noted  . Tremor 06/17/2013    [facility] 05/19/2015, 4:43 PM  [facility] [address] [place], [place], [address] Phone: [phone]   Fax:  [phone]  Name: SHAKENDRA GRIFFETH MRN: [id number] Date of Birth: Sep 09, 1955  Willa Frater, OTR/L [facility] [address] [place], [place]  [address] [phone] phone [phone] 05/19/2015 4:43 PM

## 2015-05-20 NOTE — Therapy (Signed)
[facility] [address] [place], [place], [address] Phone: [phone]   Fax:  [phone]  Physical Therapy Treatment  Patient Details  Name: Laura Lawrence MRN: [id number] Date of Birth: June 04, 1955 Referring Provider: Marjory Lies  Encounter Date: 05/19/2015      PT End of Session - 05/20/15 0906    Visit Number 2   Number of Visits 17   Date for PT Re-Evaluation 06/28/15   Authorization Type BCBS-30 visit limit combined   PT Start Time 1319   PT Stop Time 1400   PT Time Calculation (min) 41 min   Activity Tolerance Patient tolerated treatment well   Behavior During Therapy [facility] for tasks assessed/performed      Past Medical History  Diagnosis Date  . Rheumatoid arthritis ([facility])   . Hypercholesterolemia   . Hypertension   . Multiple allergies   . Parkinson's disease ([facility])     No past surgical history on file.  There were no vitals filed for this visit.  Visit Diagnosis:  Abnormality of gait      Subjective Assessment - 05/19/15 1322    Subjective Pt reports a fall 2 weeks ago as "one of the toughest falls I've taken".  Went to turn and tripped over box and fell and hit head.  Denies LOC.     Pertinent History History of Rheumatoid Arthritis   Patient Stated Goals Pt's goal for therapy is to improve transitional movements-floor, transfers, bed mobility.   Currently in Pain? No/denies            [facility] Adult PT Treatment/Exercise - 05/20/15 0902    Transfers   Transfers Sit to Stand;Stand to Sit   Sit to Stand 6: Modified independent (Device/Increase time);With upper extremity assist;From chair/3-in-1   Stand to Sit 6: Modified independent (Device/Increase time)   Ambulation/Gait   Ambulation/Gait Yes   Ambulation/Gait Assistance 5: Supervision   Ambulation/Gait Assistance Details Trial of U-step and 4 wheeled Rollator.  Pt prefers Rollator due to decreased weight for transport and turning  radius/size.  Pt without significant gait differences with one device compared to the other.  Only trialed on level indoor ground.   Ambulation Distance (Feet) 400 Feet  plus-twice;120'x2 with [facility]   Assistive device Rollator;Other (Comment);Straight cane  U-step   Gait Pattern Poor foot clearance - left;Poor foot clearance - right;Step-through pattern;Decreased arm swing - right;Decreased arm swing - left;Decreased step length - right;Decreased step length - left;Decreased dorsiflexion - right;Decreased dorsiflexion - left   Ambulation Surface Level;Indoor   Gait Comments cues for heel strike on Left and for safety/instruction with devices   Berg Balance Test   Sit to Stand Able to stand  independently using hands   Standing Unsupported Able to stand safely 2 minutes   Sitting with Back Unsupported but Feet Supported on Floor or Stool Able to sit safely and securely 2 minutes   Stand to Sit Sits safely with minimal use of hands   Transfers Able to transfer safely, minor use of hands   Standing Unsupported with Eyes Closed Able to stand 10 seconds safely   Standing Ubsupported with Feet Together Able to place feet together independently and stand 1 minute safely   From Standing, Reach Forward with Outstretched Arm Can reach forward >12 cm safely (5")   From Standing Position, Pick up Object from Floor Able to pick up shoe safely and easily   From Standing Position, Turn to Look Behind Over each Shoulder Looks behind  one side only/other side shows less weight shift   Turn 360 Degrees Able to turn 360 degrees safely but slowly   Standing Unsupported, Alternately Place Feet on Step/Stool Able to complete >2 steps/needs minimal assist   Standing Unsupported, One Foot in Front Able to take small step independently and hold 30 seconds   Standing on One Leg Tries to lift leg/unable to hold 3 seconds but remains standing independently   Total Score 43                PT Education - 05/20/15  0905    Education provided Yes   Education Details BERG score;Rollator vs U-step   Person(s) Educated Patient   Methods Explanation;Demonstration   Comprehension Verbalized understanding          PT Short Term Goals - 04/30/15 2208    PT SHORT TERM GOAL #1   Title Pt will be independent with HEP for improved balance, transfers, posture, gait.  TARGET 05/29/15   Time 4   Period Weeks   Status New   PT SHORT TERM GOAL #2   Title Pt will be able to perform 6 of 10 reps of sit<>stand transfers with minimal UE support, for improved safety and efficiency with transfers.   Time 4   Period Weeks   Status New   PT SHORT TERM GOAL #3   Title Pt will improve TUG socre to less than or equal to 13.5 seconds for decreased fall risk.   Time 4   Period Weeks   Status New   PT SHORT TERM GOAL #4   Title Pt will verbalize/demonstrate tips to reduce freezing episodes/falls with gait and turns.   Time 4   Period Weeks   Status New   PT SHORT TERM GOAL #5   Title Pt will ambulate at least 500 ft using least restrictive assistive device, with supervision for improved safety and efficiency with gait.   Time 4   Period Weeks   Status New           PT Long Term Goals - 04/30/15 2210    PT LONG TERM GOAL #1   Title Pt will report at least 25% improvement in bed mobility.  TARGET 06/28/15   Time 8   Period Weeks   Status New   PT LONG TERM GOAL #2   Title Pt will improve TUG manual score to less than or equal to 15 seconds for decreased fall risk.   Time 8   Period Weeks   Status New   PT LONG TERM GOAL #3   Title Pt will improve gait velocity to at least 2.62 ft/sec with appropriate assistive device for improved gait efficiency and safety.   Time 8   Period Weeks   Status New   PT LONG TERM GOAL #4   Title Pt will perform floor>stand transfer with UE support with minimal assistance for improved fall recovery.   Time 8   Period Weeks   Status New               Plan -  05/20/15 0906    Clinical Impression Statement Pt reporting significant fall since initial evaluation.  Gait improved with assistive device and pt reports increased endurance and decreased knee pressure/discomfort with device.  Continue PT per POC.   Pt will benefit from skilled therapeutic intervention in order to improve on the following deficits Abnormal gait;Decreased balance;Decreased mobility;Decreased knowledge of use of DME;Decreased strength;Difficulty walking;Postural dysfunction  Rehab Potential Good   PT Frequency 2x / week   PT Duration 8 weeks  plus eval; may change due to visit limitations   PT Treatment/Interventions ADLs/Self Care Home Management;Therapeutic exercise;Therapeutic activities;Functional mobility training;Gait training;DME Instruction;Balance training;Neuromuscular re-education;Patient/family education   PT Next Visit Plan Transfer training for improved safety with transfers; gait trials outdoors/unlevel surfaces with 4-wheeled rollator versus U-step RW; turning practice.   Consulted and Agree with Plan of Care Patient        Problem List Patient Active Problem List   Diagnosis Date Noted  . Tremor 06/17/2013    Newell Coral 05/20/2015, 9:09 AM  [facility] [address] [place], [place], [address] Phone: [phone]   Fax:  [phone]  Name: Laura Lawrence MRN: [id number] Date of Birth: 1955/08/22    Newell Coral, [place] [facility] 05/20/2015 9:09 AM Phone: [phone] Fax: [phone]

## 2015-05-21 ENCOUNTER — Ambulatory Visit: Payer: BLUE CROSS/BLUE SHIELD | Admitting: Occupational Therapy

## 2015-05-21 ENCOUNTER — Ambulatory Visit: Payer: BLUE CROSS/BLUE SHIELD | Admitting: Physical Therapy

## 2015-05-21 DIAGNOSIS — R2689 Other abnormalities of gait and mobility: Secondary | ICD-10-CM

## 2015-05-21 DIAGNOSIS — R293 Abnormal posture: Secondary | ICD-10-CM

## 2015-05-21 DIAGNOSIS — R29898 Other symptoms and signs involving the musculoskeletal system: Secondary | ICD-10-CM

## 2015-05-21 DIAGNOSIS — R29818 Other symptoms and signs involving the nervous system: Secondary | ICD-10-CM

## 2015-05-21 DIAGNOSIS — R278 Other lack of coordination: Secondary | ICD-10-CM

## 2015-05-21 DIAGNOSIS — R269 Unspecified abnormalities of gait and mobility: Secondary | ICD-10-CM | POA: Diagnosis not present

## 2015-05-21 NOTE — Therapy (Signed)
[facility] [address] [place], [place], [address] Phone: [phone]   Fax:  [phone]  Physical Therapy Treatment  Patient Details  Name: Laura Lawrence MRN: [id number] Date of Birth: 1955/08/04 Referring Provider: Marjory Lies  Encounter Date: 05/21/2015      PT End of Session - 05/21/15 1754    Visit Number 3   Number of Visits 17   Date for PT Re-Evaluation 06/28/15   Authorization Type BCBS-30 visit limit combined   Authorization - Visit Number 3   Authorization - Number of Visits 10   PT Start Time 1402   PT Stop Time 1445   PT Time Calculation (min) 43 min   Equipment Utilized During Treatment Gait belt   Activity Tolerance Patient tolerated treatment well   Behavior During Therapy [facility] for tasks assessed/performed      Past Medical History  Diagnosis Date  . Rheumatoid arthritis (HCC)   . Hypercholesterolemia   . Hypertension   . Multiple allergies   . Parkinson's disease (HCC)     No past surgical history on file.  There were no vitals filed for this visit.  Visit Diagnosis:  Other abnormalities of gait and mobility      Subjective Assessment - 05/21/15 1406    Subjective Pt has fall yesterday while turning outside at car, did not have her cane and fell into grass.     Pertinent History History of Rheumatoid Arthritis   Patient Stated Goals Pt's goal for therapy is to improve transitional movements-floor, transfers, bed mobility.   Currently in Pain? No/denies  Was hurting all over earlier but took a pain pill and 0/10 now                         [facility] Adult PT Treatment/Exercise - 05/21/15 0001    Transfers   Transfers Sit to Stand;Stand to Sit   Sit to Stand 6: Modified independent (Device/Increase time);With upper extremity assist;From chair/3-in-1   Stand to Sit 6: Modified independent (Device/Increase time)   Ambulation/Gait   Ambulation/Gait Yes   Ambulation/Gait Assistance 5: Supervision   Ambulation Distance (Feet) 500 Feet  plus   Assistive device Rollator   Gait Pattern Poor foot clearance - left;Poor foot clearance - right;Step-through pattern;Decreased arm swing - right;Decreased arm swing - left;Decreased step length - right;Decreased step length - left;Decreased dorsiflexion - right;Decreased dorsiflexion - left   Ambulation Surface Level;Unlevel;Indoor;Outdoor;Paved;Other (comment)  concrete sidewalk           P[facility]) - 05/21/15 1607    PWR! exercises Moves in supine   PWR! Up 20   PWR! Rock 20   PWR! Twist 20   PWR! Step 20   Comments Cues for technique and intensity;bridging x 10 reps;used targets for Rock to assist with completing roll/rock     Bed mobility-Pt reports difficulty with bed mobility.  Pt stands at edge of bed and crawls onto bed on hands and knees then transitions to her side.  From side lying, pt with difficulty pushing with UE's and needs to use momentum to come to sitting.  Worked on technique to get UE/forearm in better position to push up as well as rolling and scooting to head of bed and over in bed.        PT Education - 05/21/15 1752    Education provided Yes   Education Details Options for obtaining rollator, need for order for rollator   Person(s)  Educated Patient   Methods Explanation   Comprehension Verbalized understanding          PT Short Term Goals - 04/30/15 2208    PT SHORT TERM GOAL #1   Title Pt will be independent with HEP for improved balance, transfers, posture, gait.  TARGET 05/29/15   Time 4   Period Weeks   Status New   PT SHORT TERM GOAL #2   Title Pt will be able to perform 6 of 10 reps of sit<>stand transfers with minimal UE support, for improved safety and efficiency with transfers.   Time 4   Period Weeks   Status New   PT SHORT TERM GOAL #3   Title Pt will improve TUG socre to less than or equal to 13.5 seconds for decreased fall risk.   Time 4    Period Weeks   Status New   PT SHORT TERM GOAL #4   Title Pt will verbalize/demonstrate tips to reduce freezing episodes/falls with gait and turns.   Time 4   Period Weeks   Status New   PT SHORT TERM GOAL #5   Title Pt will ambulate at least 500 ft using least restrictive assistive device, with supervision for improved safety and efficiency with gait.   Time 4   Period Weeks   Status New           PT Long Term Goals - 04/30/15 2210    PT LONG TERM GOAL #1   Title Pt will report at least 25% improvement in bed mobility.  TARGET 06/28/15   Time 8   Period Weeks   Status New   PT LONG TERM GOAL #2   Title Pt will improve TUG manual score to less than or equal to 15 seconds for decreased fall risk.   Time 8   Period Weeks   Status New   PT LONG TERM GOAL #3   Title Pt will improve gait velocity to at least 2.62 ft/sec with appropriate assistive device for improved gait efficiency and safety.   Time 8   Period Weeks   Status New   PT LONG TERM GOAL #4   Title Pt will perform floor>stand transfer with UE support with minimal assistance for improved fall recovery.   Time 8   Period Weeks   Status New               Plan - 05/21/15 1755    Clinical Impression Statement Pt with another fall yesterday while turning.  Wishes to pursue rollator and patient has prescription from MD and will purchase rollator.  Improved bed mobility after PWR! supine today during session.  Continue PT per POC.   Pt will benefit from skilled therapeutic intervention in order to improve on the following deficits Abnormal gait;Decreased balance;Decreased mobility;Decreased knowledge of use of DME;Decreased strength;Difficulty walking;Postural dysfunction   Rehab Potential Good   PT Frequency 2x / week   PT Duration 8 weeks  plus eval; may change due to visit limitations   PT Treatment/Interventions ADLs/Self Care Home Management;Therapeutic exercise;Therapeutic activities;Functional mobility  training;Gait training;DME Instruction;Balance training;Neuromuscular re-education;Patient/family education   PT Next Visit Plan Transfer training for improved safety with transfers; turning practice, standing PWR! at counter/chair for UE support.   Consulted and Agree with Plan of Care Patient        Problem List Patient Active Problem List   Diagnosis Date Noted  . Tremor 06/17/2013    Newell Coral 05/21/2015, 5:59 PM  [facility] [address] [place], [place], [address] Phone: [phone]   Fax:  [phone]  Name: Laura Lawrence MRN: [id number] Date of Birth: [birth year]    Newell Coral, [place] [facility] 05/21/2015 5:59 PM Phone: [phone] Fax: [phone]

## 2015-05-21 NOTE — Therapy (Signed)
[facility] [address] [place], [place], [address] Phone: [phone]   Fax:  [phone]  Occupational Therapy Treatment  Patient Details  Name: Laura Lawrence MRN: [id number] Date of Birth: 08-14-1955 Referring Provider: Dr. Marjory Lies  Encounter Date: 05/21/2015      OT End of Session - 05/21/15 1523    Visit Number 3   Number of Visits 17   Date for OT Re-Evaluation 06/29/15   Authorization Type BCBS   Authorization - Visit Number 2   Authorization - Number of Visits 10   OT Start Time 1450  2 units only pt went to [facility]   OT Stop Time 1530   OT Time Calculation (min) 40 min      Past Medical History  Diagnosis Date  . Rheumatoid arthritis (HCC)   . Hypercholesterolemia   . Hypertension   . Multiple allergies   . Parkinson's disease (HCC)     No past surgical history on file.  There were no vitals filed for this visit.  Visit Diagnosis:  Other lack of coordination  Other symptoms and signs involving the musculoskeletal system  Other symptoms and signs involving the nervous system  Abnormal posture              Ball exercises for chest press and shoulder flexion in supine x 20 reps each min v.c. Adapted strategies for fastening buttons with PWR! Hands, Pt practiced feeding with foam grips with increased ease, therapist issued for home.         PWR [facility]) - 05/21/15 1607    PWR! exercises Moves in sitting   PWR! Up 10   PWR! Rock 10   PWR! Twist 20   PWR! Step 10   Comments min v.c./ demonstration for large amplitude movements               OT Short Term Goals - 04/29/15 1655    OT SHORT TERM GOAL #1   Title Pt will be independent with HEP    Time 4   Period Weeks   Status New   OT SHORT TERM GOAL #2   Title Pt will reduce simluated dressing task on Physical Performance test #4 by 5 seconds    Baseline 40.67   Time 4   Period Weeks   Status New   OT SHORT  TERM GOAL #3   Title Pt will verbalize understanding of adapted strategies for ADLS/IADLS.   Time 4   Period Weeks   Status New   OT SHORT TERM GOAL #4   Title Pt will verbalize understanding of compensatory strategies for short term memory deficits and ways to keep thinking skills sharp.   Time 4   Period Weeks   Status New   OT SHORT TERM GOAL #5   Title Pt will verbalize understanding of community resources and ways to prevent future PD related complications.   Time 4   Period Weeks   Status New           OT Long Term Goals - 04/29/15 1657    OT LONG TERM GOAL #1   Title Pt will decrease time on 3 button/ unbutton task to 26 sec or less   Baseline 31.09   Time 8   Period Weeks   Status New   OT LONG TERM GOAL #2   Title Pt will decrease 9 hole peg test by 3 seconds for LUE   Baseline 37.10   Time 8  Period Weeks   Status New   OT LONG TERM GOAL #3   Title Pt will decrease simulated dressing task on Physical Peformance test by 10 seconds    Baseline 40.67   Time 8   Period Weeks   OT LONG TERM GOAL #4   Title Pt will decrease simluated eating task on Physical Performance Test by 3 seconds    Baseline 13.90   Time 8   Period Weeks   Status New   OT LONG TERM GOAL #5   Title Pt will retrieve a lightweight object from overhead shelf with -15 elbow extension and pain no greater then 3/10 (for bilaterally UE's tested individually)   Baseline RUE elbow -20, LUE elbow -25   Time 8   Period Weeks   Status New   OT LONG TERM GOAL #6   Title --------------------------------------------------------------------------------------------------------------------------   Status New               Plan - 05/21/15 1606    Clinical Impression Statement Pt is progressing towards goals with improving A/ROM bilaterally.   Rehab Potential Good   OT Frequency 2x / week   OT Duration 8 weeks   OT Treatment/Interventions Self-care/ADL training;Therapeutic  exercise;Functional Mobility Training;Patient/family education;Balance training;Splinting;Manual Therapy;Ultrasound;Neuromuscular education;Energy conservation;Therapeutic exercises;Therapeutic activities;DME and/or AE instruction;Parrafin;Cryotherapy;Electrical Stimulation;Fluidtherapy;Gait Training;Scar mobilization;Cognitive remediation/compensation;Passive range of motion;Visual/perceptual remediation/compensation;Contrast Bath;Moist Heat   Plan reinforce PWR! seated if needed, big movements with ADLS.   OT Home Exercise Plan issued PWR! seated,    Consulted and Agree with Plan of Care Patient        Problem List Patient Active Problem List   Diagnosis Date Noted  . Tremor 06/17/2013    Arvon Schreiner 05/21/2015, 4:08 PM Keene Breath, OTR/L [phone] Phone: [phone] 4:08 PM 05/21/2015 [facility] [address] [place], [place], [address] Phone: [phone]   Fax:  [phone]  Name: Laura Lawrence MRN: [id number] Date of Birth: 02/18/1955

## 2015-05-26 ENCOUNTER — Ambulatory Visit: Payer: BLUE CROSS/BLUE SHIELD | Admitting: Physical Therapy

## 2015-05-26 ENCOUNTER — Ambulatory Visit: Payer: BLUE CROSS/BLUE SHIELD | Admitting: Occupational Therapy

## 2015-05-26 DIAGNOSIS — R293 Abnormal posture: Secondary | ICD-10-CM

## 2015-05-26 DIAGNOSIS — R29898 Other symptoms and signs involving the musculoskeletal system: Secondary | ICD-10-CM

## 2015-05-26 DIAGNOSIS — R269 Unspecified abnormalities of gait and mobility: Secondary | ICD-10-CM | POA: Diagnosis not present

## 2015-05-26 DIAGNOSIS — R2689 Other abnormalities of gait and mobility: Secondary | ICD-10-CM

## 2015-05-26 DIAGNOSIS — R29818 Other symptoms and signs involving the nervous system: Secondary | ICD-10-CM

## 2015-05-26 DIAGNOSIS — R278 Other lack of coordination: Secondary | ICD-10-CM

## 2015-05-26 NOTE — Therapy (Signed)
[facility] [address] [place], [place], [address] Phone: [phone]   Fax:  [phone]  Occupational Therapy Treatment  Patient Details  Name: Laura Lawrence MRN: [id number] Date of Birth: 10/09/1955 Referring Provider: Dr. Marjory Lies  Encounter Date: 05/26/2015      OT End of Session - 05/26/15 0913    Visit Number 4   Number of Visits 17   Date for OT Re-Evaluation 06/29/15   Authorization Type BCBS   Authorization - Visit Number 4   Authorization - Number of Visits 10   OT Start Time 0848   OT Stop Time 0930   OT Time Calculation (min) 42 min   Activity Tolerance Patient tolerated treatment well      Past Medical History  Diagnosis Date  . Rheumatoid arthritis (HCC)   . Hypercholesterolemia   . Hypertension   . Multiple allergies   . Parkinson's disease (HCC)     No past surgical history on file.  There were no vitals filed for this visit.      Subjective Assessment - 05/26/15 0911    Subjective  "My writing gets small.  I can usually read the first 2 sentences"  "but this is good and I can read it"   Pertinent History see Epic    Patient Stated Goals increased ease with ADLs   Currently in Pain? Yes   Pain Score 3    Pain Location Back   Pain Orientation Lower   Pain Descriptors / Indicators Sore   Pain Type Chronic pain   Pain Frequency Intermittent   Aggravating Factors  overuse   Pain Relieving Factors pain meds                      OT Treatments/Exercises (OP) - 05/26/15 0001    ADLs   UB Dressing Practiced donning pull-over shirt after instruction for adaptive strategies and use of large amplitude movements.  Pt reports improvement with strategies (arms in first, pull all the way up, gather material in hand with end turned out, then over head).       Self Care:    Dressing:   Simulated ADLs with bag with focus/min cues for large amplitude movements:   donning/doffing pants.  Also tried footstool for RLE with incr ease.  Instructed pt in use of short reacher for donning pants and practiced with using theraband.  Pt returned demo/verbalized understanding.  Writing:  Practiced copying 5 sentences with built up grip with focus/min cues on letter formation/size.  100% legibility and min cueing for use of PWR! Hands stretch after each sentence.  No significant decr in size.    Neuro re-ed:  PWR! Hands (basic 4)  x 5-8 each with min cues For incr movement amplitude. (prior to writing)   PWR! Moves (basic 4) in sitting x 10 each with min cues For incr movement amplitude.                 OT Short Term Goals - 04/29/15 1655    OT SHORT TERM GOAL #1   Title Pt will be independent with HEP    Time 4   Period Weeks   Status New   OT SHORT TERM GOAL #2   Title Pt will reduce simluated dressing task on Physical Performance test #4 by 5 seconds    Baseline 40.67   Time 4   Period Weeks   Status New   OT SHORT TERM  GOAL #3   Title Pt will verbalize understanding of adapted strategies for ADLS/IADLS.   Time 4   Period Weeks   Status New   OT SHORT TERM GOAL #4   Title Pt will verbalize understanding of compensatory strategies for short term memory deficits and ways to keep thinking skills sharp.   Time 4   Period Weeks   Status New   OT SHORT TERM GOAL #5   Title Pt will verbalize understanding of community resources and ways to prevent future PD related complications.   Time 4   Period Weeks   Status New           OT Long Term Goals - 04/29/15 1657    OT LONG TERM GOAL #1   Title Pt will decrease time on 3 button/ unbutton task to 26 sec or less   Baseline 31.09   Time 8   Period Weeks   Status New   OT LONG TERM GOAL #2   Title Pt will decrease 9 hole peg test by 3 seconds for LUE   Baseline 37.10   Time 8   Period Weeks   Status New   OT LONG TERM GOAL #3   Title Pt will decrease simulated dressing task  on Physical Peformance test by 10 seconds    Baseline 40.67   Time 8   Period Weeks   OT LONG TERM GOAL #4   Title Pt will decrease simluated eating task on Physical Performance Test by 3 seconds    Baseline 13.90   Time 8   Period Weeks   Status New   OT LONG TERM GOAL #5   Title Pt will retrieve a lightweight object from overhead shelf with -15 elbow extension and pain no greater then 3/10 (for bilaterally UE's tested individually)   Baseline RUE elbow -20, LUE elbow -25   Time 8   Period Weeks   Status New   OT LONG TERM GOAL #6   Title --------------------------------------------------------------------------------------------------------------------------   Status New               Plan - 05/26/15 6010    Clinical Impression Statement Pt is progressing with UE functional use and ADLs.   Rehab Potential Good   OT Frequency 2x / week   OT Duration 8 weeks  +eval   OT Treatment/Interventions Self-care/ADL training;Therapeutic exercise;Functional Mobility Training;Patient/family education;Balance training;Splinting;Manual Therapy;Ultrasound;Neuromuscular education;Energy conservation;Therapeutic exercises;Therapeutic activities;DME and/or AE instruction;Parrafin;Cryotherapy;Electrical Stimulation;Fluidtherapy;Gait Training;Scar mobilization;Cognitive remediation/compensation;Passive range of motion;Visual/perceptual remediation/compensation;Contrast Bath;Moist Heat   Plan ? modified quadraped PWR!, big movements for ADLs   OT Home Exercise Plan Education Provided:  issued PWR! seated; coordination HEP, PWR! hands (basic 4)   Consulted and Agree with Plan of Care Patient      Patient will benefit from skilled therapeutic intervention in order to improve the following deficits and impairments:     Visit Diagnosis: Other symptoms and signs involving the nervous system  Other symptoms and signs involving the musculoskeletal system  Other lack of coordination  Abnormal  posture    Problem List Patient Active Problem List   Diagnosis Date Noted  . Tremor 06/17/2013    [facility] 05/26/2015, 5:47 PM  [facility] [address] [place], [place], [address] Phone: [phone]   Fax:  [phone]  Name: Laura Lawrence MRN: [id number] Date of Birth: October 13, 1955  Willa Frater, OTR/L [facility] [address] [place], [place]  [address] [phone] phone [phone] 05/26/2015  5:47 PM

## 2015-05-26 NOTE — Therapy (Signed)
[facility] [address] [place], [place], [address] Phone: [phone]   Fax:  [phone]  Physical Therapy Treatment  Patient Details  Name: Laura Lawrence MRN: [id number] Date of Birth: 05-08-1955 Referring Provider: Marjory Lies  Encounter Date: 05/26/2015      PT End of Session - 05/26/15 1305    Visit Number 4   Number of Visits 17   Date for PT Re-Evaluation 06/28/15   Authorization Type BCBS-30 visit limit combined   Authorization - Visit Number 3   Authorization - Number of Visits 10   PT Start Time 0803   PT Stop Time 0845   PT Time Calculation (min) 42 min   Equipment Utilized During Treatment Gait belt   Activity Tolerance Patient tolerated treatment well   Behavior During Therapy [facility] for tasks assessed/performed      Past Medical History  Diagnosis Date  . Rheumatoid arthritis (HCC)   . Hypercholesterolemia   . Hypertension   . Multiple allergies   . Parkinson's disease (HCC)     No past surgical history on file.  There were no vitals filed for this visit.      Subjective Assessment - 05/26/15 0807    Subjective Pt fell last night while getting out of the bathtub.  Caught self between toilet and tub.  Does not have grab bars.   Pertinent History History of Rheumatoid Arthritis   Patient Stated Goals Pt's goal for therapy is to improve transitional movements-floor, transfers, bed mobility.   Currently in Pain? Yes   Pain Score 3   Did not take a pain pill this morning   Pain Location Back   Pain Orientation Lower   Pain Descriptors / Indicators Sore  stiff   Pain Type Chronic pain   Pain Onset More than a month ago   Pain Frequency Intermittent   Aggravating Factors  overuse   Pain Relieving Factors pain meds             [facility] Adult PT Treatment/Exercise - 05/26/15 0001    High Level Balance   High Level Balance Activities Other (comment)   High Level Balance Comments  Forward, backward and forward<>backward stepping weight shifts at counter with UE support;needs UE assist to recover LOB at times;Needed moderate cues for technique   Knee/Hip Exercises: Aerobic   Recumbent Bike Scifit level 1.5 for flexibility x all 4 extremities x 9 minutes           PWR [facility]) - 05/26/15 1301    PWR! exercises Moves in standing   PWR! Up 20   PWR! Rock 20   PWR! Twist 20   PWR Step 20   Comments In standing at counter with UE support             PT Education - 05/26/15 0834    Education provided Yes   Education Details Intensity with movements, PWR! standing at chair for UE support   Person(s) Educated Patient   Methods Explanation   Comprehension Verbalized understanding          PT Short Term Goals - 04/30/15 2208    PT SHORT TERM GOAL #1   Title Pt will be independent with HEP for improved balance, transfers, posture, gait.  TARGET 05/29/15   Time 4   Period Weeks   Status New   PT SHORT TERM GOAL #2   Title Pt will be able to perform 6 of 10 reps of sit<>stand transfers with  minimal UE support, for improved safety and efficiency with transfers.   Time 4   Period Weeks   Status New   PT SHORT TERM GOAL #3   Title Pt will improve TUG socre to less than or equal to 13.5 seconds for decreased fall risk.   Time 4   Period Weeks   Status New   PT SHORT TERM GOAL #4   Title Pt will verbalize/demonstrate tips to reduce freezing episodes/falls with gait and turns.   Time 4   Period Weeks   Status New   PT SHORT TERM GOAL #5   Title Pt will ambulate at least 500 ft using least restrictive assistive device, with supervision for improved safety and efficiency with gait.   Time 4   Period Weeks   Status New           PT Long Term Goals - 04/30/15 2210    PT LONG TERM GOAL #1   Title Pt will report at least 25% improvement in bed mobility.  TARGET 06/28/15   Time 8   Period Weeks   Status New   PT LONG TERM GOAL #2   Title Pt will  improve TUG manual score to less than or equal to 15 seconds for decreased fall risk.   Time 8   Period Weeks   Status New   PT LONG TERM GOAL #3   Title Pt will improve gait velocity to at least 2.62 ft/sec with appropriate assistive device for improved gait efficiency and safety.   Time 8   Period Weeks   Status New   PT LONG TERM GOAL #4   Title Pt will perform floor>stand transfer with UE support with minimal assistance for improved fall recovery.   Time 8   Period Weeks   Status New               Plan - 05/26/15 1306    Clinical Impression Statement Pt continues to have falls in community.  Last one was while performing SLS to get out of tub.  Pt is obtaining Rollator this week.  Also going to podiatrist to have toenail removed so may need to cancel second appointment this week based on MD.  Need to continue to reinforce safety with mobility.  Continue PT per POC.   Rehab Potential Good   PT Frequency 2x / week   PT Duration 8 weeks  plus eval; may change due to visit limitations   PT Treatment/Interventions ADLs/Self Care Home Management;Therapeutic exercise;Therapeutic activities;Functional mobility training;Gait training;DME Instruction;Balance training;Neuromuscular re-education;Patient/family education   PT Next Visit Plan begin checking goals;fall prevention   Consulted and Agree with Plan of Care Patient      Patient will benefit from skilled therapeutic intervention in order to improve the following deficits and impairments:  Abnormal gait, Decreased balance, Decreased mobility, Decreased knowledge of use of DME, Decreased strength, Difficulty walking, Postural dysfunction  Visit Diagnosis: Other abnormalities of gait and mobility  Other symptoms and signs involving the musculoskeletal system     Problem List Patient Active Problem List   Diagnosis Date Noted  . Tremor 06/17/2013    Newell Coral 05/26/2015, 1:08 PM  [facility] [address] [place], [place], [address] Phone: [phone]   Fax:  [phone]  Name: Laura Lawrence MRN: [id number] Date of Birth: 05-10-1955    Newell Coral, [place] [facility] 05/26/2015 1:08 PM Phone: [phone] Fax: [phone]

## 2015-05-27 ENCOUNTER — Ambulatory Visit (INDEPENDENT_AMBULATORY_CARE_PROVIDER_SITE_OTHER): Payer: BLUE CROSS/BLUE SHIELD | Admitting: Diagnostic Neuroimaging

## 2015-05-27 ENCOUNTER — Encounter: Payer: Self-pay | Admitting: Diagnostic Neuroimaging

## 2015-05-27 VITALS — BP 95/58 | HR 64 | Ht 68.0 in | Wt 222.2 lb

## 2015-05-27 DIAGNOSIS — R2681 Unsteadiness on feet: Secondary | ICD-10-CM | POA: Diagnosis not present

## 2015-05-27 DIAGNOSIS — G20 Parkinson's disease: Secondary | ICD-10-CM | POA: Diagnosis not present

## 2015-05-27 DIAGNOSIS — R600 Localized edema: Secondary | ICD-10-CM | POA: Diagnosis not present

## 2015-05-27 MED ORDER — ROTIGOTINE 1 MG/24HR TD PT24: 1.0000 | MEDICATED_PATCH | Freq: Every day | TRANSDERMAL | Status: DC

## 2015-05-27 NOTE — Patient Instructions (Addendum)
Thank you for coming to see [place] at [facility]. I hope we have been able to provide you high quality care today.  You may receive a patient satisfaction survey over the next few weeks. We would appreciate your feedback and comments so that we may continue to improve ourselves and the health of our patients.  Please read about deep brain stimulation: - https://www.patrick.info/  Start neupro patch 35m/day.  Continue carbidopa/levodopa.  Use walker.   Manage peripheral edema (less salt, more physical activity, raise feet up when laying down, use compression stockings).  ~~~~~~~~~~~~~~~~~~~~~~~~~~~~~~~~~~~~~~~~~~~~~~~~~~~~~~~~~~~~~~~~~  DR. Keauna Brasel'S GUIDE TO HAPPY AND HEALTHY LIVING These are some of my general health and wellness recommendations. Some of them may apply to you better than others. Please use common sense as you try these suggestions and feel free to ask me any questions.   ACTIVITY/FITNESS Mental, social, emotional and physical stimulation are very important for brain and body health. Try learning a new activity (arts, music, language, sports, games).  Keep moving your body to the best of your abilities. You can do this at home, inside or outside, the park, community center, gym or anywhere you like. Consider a physical therapist or personal trainer to get started. Consider the app Sworkit. Fitness trackers such as smart-watches, smart-phones or Fitbits can help as well.   NUTRITION Eat more plants: colorful vegetables, nuts, seeds and berries.  Eat less sugar, salt, preservatives and processed foods.  Avoid toxins such as cigarettes and alcohol.  Drink water when you are thirsty. Warm water with a slice of lemon is an excellent morning drink to start the day.  Consider these websites for more information The Nutrition Source  (hhttps://www.henry-hernandez.biz/ Precision Nutrition (wWindowBlog.ch   RELAXATION Consider practicing mindfulness meditation or other relaxation techniques such as deep breathing, prayer, yoga, tai chi, massage. See website mindful.org or the apps Headspace or Calm to help get started.   SLEEP Try to get at least 7-8+ hours sleep per day. Regular exercise and reduced caffeine will help you sleep better. Practice good sleep hygeine techniques. See website sleep.org for more information.   PLANNING Prepare estate planning, living will, healthcare POA documents. Sometimes this is best planned with the help of an attorney. Theconversationproject.org and agingwithdignity.org are excellent resources.

## 2015-05-27 NOTE — Progress Notes (Signed)
[facility]  PATIENT: Laura Lawrence DOB: 09/22/1955  REFERRING CLINICIAN:  HISTORY FROM: patient and husband  REASON FOR VISIT: follow up   HISTORICAL  CHIEF COMPLAINT:  Chief Complaint  Patient presents with  . Parkinson's disease    rm 7, husband- Laura Lawrence, "therapy going well, fell x 2- no injury; has not ordered walker yet""  . Follow-up    3 month    HISTORY OF PRESENT ILLNESS:   UPDATE 05/27/15: Since last visit, tried neupro 2mg  patch, and felt better tremor control and mobility. Unfortunately developed more leg edema 2 weeks after starting neupro. Now off patch x 3 weeks and edema improving.  UPDATE 02/25/15: Since last visit, tried rytary, and had more wearing off. Now back to regular carb/levo and back to baseline. She notes some dystonia in left foot. Kidney issue now felt to be a form of pyelonephritis. 4 morefalls since last visit.   UPDATE 11/18/14: Since last visit, continues on carb/levo 25/100 every 2.5 hours (5-6 tabs per day). Some wearing off after 2-3 hours. Also with new hematuria, and left renal cyst and pancreatic cyst, pending further workup. 2 more falls since last visit. Not using walker that much, but is planning to do so now. PT and OT helped back in March and April 2016.   UPDATE 05/20/14: Since last visit, doing well. Swallow testing per PCP showed primary esophageal dysphagia. No falls. Taking carb/levo 1 tab q3 hrs, 4-5 x per day, and feeling better. Headaches continue, mild tension type. No mood lability.   UPDATE 02/11/14: Since last visit patient reports increasing headaches, constipation, bladder incontinence, early morning nausea, unsteadiness and progressive hair loss. Patient tolerating carbidopa/levodopa. 2-3 months ago she stopped her sertraline, which she had been taking for 15 years for anxiety/depression and headache treatment.  UPDATE 10/10/13 (PS): Overall doing well. Had one fall since last visit, notes her feet got  tied up and wouldn't move. Notes doing well with the Sinemet. Currently taking Sinemet 25-100 QID at 8am, noon, 4pm and 8pm. Denies any motor fluctuations, no wearing off noted. No dyskinesias. Happy with current control, feels it has made a big difference. Sleeping well, minimal REM behavior disorder. Notes prior lab test showing elevated LFTs. Following up with rheumatology, they may consider tapering off of the methotrexate.   UPDATE 07/31/13 (PS): At last visit she had a MRI of the lumbar spine which showed some degenerative changes but no signs of compression. Continues to have difficulty with gait, notes episodes where she will start walking, "faster and faster" and not be able to stop until she falls over. Continues to have mild rest and postural tremor. Notes worsening handwriting, described as messy and smaller.   07/16/13 (PS):  At last visit she had a MRI of the brain and C spine both of which were overall unremarkable. Returns today with continued difficulty with her walking, feels very off balance, unsteady on her feet. Notes continued rest and postural/action tremor. No other acute issues at this time.   PRIOR HPI (06/17/13, Dr. 08/17/13): Laura Lawrence is a 60 y.o. female here as a referral from Dr. 46 for tremors. Feels symptoms started around 2 months ago and has gotten progressively worse since then. Describes both a rest and action/postural tremor. Handwriting has gotten messier, no change in size. Notes some generalized slowing down of her movements. She feels off balance, having falls recently. Having difficulty with fine motor tasks. Notes some muscle cramps, aching in her legs.  Tremor worse with stress/anxiety. No change with caffeine. Does not drink EtOH. No recent changes to her medications. Around Thanksgiving has noted loss of bladder control, states she will go without even knowing she was going. No family history of tremor. Has history of HTN, no known TIA or strokes in the  past.    REVIEW OF SYSTEMS: Full 14 system review of systems performed and negative except for: leg swelling.     ALLERGIES: Allergies  Allergen Reactions  . Codeine Hives  . Guaifenesin & Derivatives Hives  . Nsaids     ulcers  . Other Diarrhea    Almonds  Almonds   . Sulfa Antibiotics Hives  . Penicillins Rash    HOME MEDICATIONS: Outpatient Prescriptions Prior to Visit  Medication Sig Dispense Refill  . acetaminophen (TYLENOL) 500 MG tablet Take 500 mg by mouth every 6 (six) hours as needed.    . Adalimumab (HUMIRA) 20 MG/0.4ML PSKT Inject 40 mg into the skin.    . ALPHA LIPOIC ACID PO Take 600 mg by mouth. 11/18/14 takes 3 tabs daily    . carbidopa-levodopa (SINEMET IR) 25-100 MG tablet Take 1 tablet by mouth 5 (five) times daily. 150 tablet 12  . Cholecalciferol (VITAMIN D-3) 1000 UNITS CAPS Take 1,000 Units by mouth.    . clobetasol cream (TEMOVATE) 0.05 % Apply 1 application topically daily.     Marland Kitchen HUMIRA PEN 40 MG/0.8ML PNKT Inject 0.8 mLs into the muscle every 14 (fourteen) days. Thursdays  2  . Multiple Vitamins-Minerals (MULTIVITAMIN WITH MINERALS) tablet Take 1 tablet by mouth daily.    Marland Kitchen nystatin (MYCOSTATIN) powder Apply topically 4 (four) times daily.    . pantoprazole (PROTONIX) 40 MG tablet Take 40 mg by mouth 2 (two) times daily.  5  . polyethylene glycol (MIRALAX / GLYCOLAX) packet Take 8.5 g by mouth daily.     . promethazine (PHENERGAN) 25 MG tablet Take 1 tablet (25 mg total) by mouth every 6 (six) hours as needed for nausea or vomiting. 30 tablet 0  . sertraline (ZOLOFT) 25 MG tablet TAKE ONE TABLET BY MOUTH ONE TIME DAILY 30 tablet 12  . HYDROcodone-acetaminophen (NORCO/VICODIN) 5-325 MG tablet Take 1 tablet by mouth every 4 (four) hours as needed. 20 tablet 0  . mirabegron ER (MYRBETRIQ) 50 MG TB24 tablet Take 50 mg by mouth at bedtime.    Marland Kitchen omega-3 acid ethyl esters (LOVAZA) 1 G capsule Take 1 g by mouth 2 (two) times daily. Reported on 04/29/2015    .  rotigotine (NEUPRO) 2 MG/24HR Place 1 patch onto the skin daily. (Patient not taking: Reported on 05/19/2015) 30 patch 0   No facility-administered medications prior to visit.    PAST MEDICAL HISTORY: Past Medical History  Diagnosis Date  . Rheumatoid arthritis (HCC)   . Hypercholesterolemia   . Hypertension   . Multiple allergies   . Parkinson's disease (HCC)     PAST SURGICAL HISTORY: History reviewed. No pertinent past surgical history.  FAMILY HISTORY: Family History  Problem Relation Age of Onset  . Heart disease Father   . Osteoporosis Father   . Hypertension Father   . Leukemia Father   . Diabetes Mother   . Arthritis Mother   . Asthma Mother   . Hypertension Mother   . Bursitis Mother   . Gout    . Brain cancer Paternal Aunt   . Muscular dystrophy Maternal Uncle     SOCIAL HISTORY:  Social History   Social History  .  Marital Status: Married    Spouse Name: Laura Lawrence  . Number of Children: 1  . Years of Education: college   Occupational History  . self employed    Social History Main Topics  . Smoking status: Never Smoker   . Smokeless tobacco: Never Used  . Alcohol Use: No  . Drug Use: No  . Sexual Activity: Not on file   Other Topics Concern  . Not on file   Social History Narrative   Patient is married Laura Lawrence), 2 children   Associates degree   Right handed   0-1 cups of coffee a day     PHYSICAL EXAM  Filed Vitals:   05/27/15 1309  BP: 95/58  Pulse: 64  Height: 5\' 8"  (1.727 m)  Weight: 222 lb 3.2 oz (100.789 kg)    Body mass index is 33.79 kg/(m^2).  No exam data present  No flowsheet data found.  GENERAL EXAM: Patient is in no distress; well developed, nourished and groomed; neck is supple  CARDIOVASCULAR: Regular rate and rhythm, no murmurs, no carotid bruits  NEUROLOGIC: MENTAL STATUS: awake, alert, language fluent, comprehension intact, naming intact, fund of knowledge appropriate; MASKED FACIES CRANIAL NERVE:   pupils equal and reactive to light, visual fields full to confrontation, extraocular muscles intact, no nystagmus, facial sensation and strength symmetric, hearing intact, palate elevates symmetrically, uvula midline, shoulder shrug symmetric, tongue midline; MASKED FACIES MOTOR: normal bulk; MOD RIGIDITY IN LUE > RUE; NO TREMOR; MOD BRADYKINESIA IN BUE AND BLE (L SLOWER THAN R); full strength in the BUE, BLE SENSORY: normal and symmetric to light touch  COORDINATION: finger-nose-finger, fine finger movements normal REFLEXES: deep tendon reflexes present and symmetric GAIT/STATION: narrow based gait; DECR ARM SWING; USES SINGLE POINT CANE    DIAGNOSTIC DATA (LABS, IMAGING, TESTING) - I reviewed patient records, labs, notes, testing and imaging myself where available.  Lab Results  Component Value Date   WBC 8.9 12/12/2014   HGB 11.8* 12/12/2014   HCT 35.4* 12/12/2014   MCV 87.2 12/12/2014   PLT 266 12/12/2014      Component Value Date/Time   NA 140 12/12/2014 1834   K 3.8 12/12/2014 1834   CL 104 12/12/2014 1834   CO2 26 12/12/2014 1834   GLUCOSE 104* 12/12/2014 1834   BUN 11 12/12/2014 1834   CREATININE 0.72 12/12/2014 1834   CALCIUM 9.5 12/12/2014 1834   PROT 6.1* 12/12/2014 1834   ALBUMIN 3.1* 12/12/2014 1834   AST 102* 12/12/2014 1834   ALT 140* 12/12/2014 1834   ALKPHOS 82 12/12/2014 1834   BILITOT 0.2* 12/12/2014 1834   GFRNONAA >60 12/12/2014 1834   GFRAA >60 12/12/2014 1834   No results found for: CHOL, HDL, LDLCALC, LDLDIRECT, TRIG, CHOLHDL No results found for: 12/14/2014 Lab Results  Component Value Date   VITAMINB12 >1999* 07/16/2013   Lab Results  Component Value Date   TSH 2.320 07/16/2013     07/09/13 MRI brain (without) demonstrating: 1. Few (~4) punctate foci of periventricular and subcortical non-specific gliosis. 2. No acute findings.  07/09/13 MRI cervical spine (without) demonstrating mild disc bulging at C3-4 and C4-5. No spinal stenosis or  foraminal narrowing. No intrinsic or compressive spinal cord lesions.  07/23/13 MRI lumbar spine (without) demonstrating: 1. At L4-5: disc bulging and facet and ligamentum flavum hypertrophy with moderate spinal stenosis and mild biforaminal stenosis  2. At L5-S1: disc bulging and facet arthropathy, with mild spinal stenosis, left lateral recess stenosis, mild right and moderate left foraminal stenosis  3. At L3-4: disc bulging, leftward disc protrusion, facet and ligamentum flavum hypertrophy with mild spinal stenosis and mild biforaminal stenosis      ASSESSMENT AND PLAN  60 y.o. year old female here with parkinson's disease, lumbar spinal stenosis, depression and rheumatoid arthritis. Some progression of symptoms and more wearing off.   MEDS TRIED/FAILED  On carbidopa/levodopa with benefit.  Tried rytary, but not that effective.   Neupro /24hr patch helped but caused peripheral edema.     Dx:  Parkinson's disease (HCC)  Gait instability  Bilateral leg edema    PLAN: - continue carb/levo 25/100 (1 tab 5x per day) - will restart lower dose of neupro patch ( /24hr) - use walker - continue PT/OT/ST evaluations and treatements - manage peripheral edema (less salt, more physical activity, raise feet up when laying down, use compression stockings)  Meds ordered this encounter  Medications  . Rotigotine 1 MG/24HR PT24    Sig: Place 1 patch (1 mg total) onto the skin daily.    Dispense:  30 patch    Refill:  12   Return in about 3 months (around 08/26/2015).    Suanne Marker, MD 05/27/2015, 1:23 PM Certified in Neurology, Neurophysiology and Neuroimaging  [facility] [address] [place], [place] [address] [phone]

## 2015-05-27 NOTE — Addendum Note (Signed)
Addended by: Keene Breath B on: 05/27/2015 03:20 PM   Modules accepted: Orders

## 2015-05-28 ENCOUNTER — Ambulatory Visit: Payer: BLUE CROSS/BLUE SHIELD | Admitting: Physical Therapy

## 2015-05-28 ENCOUNTER — Ambulatory Visit: Payer: BLUE CROSS/BLUE SHIELD | Admitting: *Deleted

## 2015-05-28 VITALS — BP 117/60 | HR 58

## 2015-05-28 DIAGNOSIS — R471 Dysarthria and anarthria: Secondary | ICD-10-CM

## 2015-05-28 DIAGNOSIS — R29818 Other symptoms and signs involving the nervous system: Secondary | ICD-10-CM

## 2015-05-28 DIAGNOSIS — R4701 Aphasia: Secondary | ICD-10-CM

## 2015-05-28 DIAGNOSIS — R269 Unspecified abnormalities of gait and mobility: Secondary | ICD-10-CM | POA: Diagnosis not present

## 2015-05-28 NOTE — Therapy (Signed)
[facility] [address] [place], [place], [address] Phone: [phone]   Fax:  [phone]  Speech Language Pathology Treatment  Patient Details  Name: SHALIMAR MCCLAIN MRN: [id number] Date of Birth: [birth year] Referring Provider: Dr. Leta Baptist  Encounter Date: 05/28/2015      End of Session - 05/28/15 1051    Visit Number 3   Number of Visits 16   Date for SLP Re-Evaluation 07/06/15   SLP Start Time 13   SLP Stop Time  1102   SLP Time Calculation (min) 42 min   Activity Tolerance Patient tolerated treatment well      Past Medical History  Diagnosis Date  . Rheumatoid arthritis ([place])   . Hypercholesterolemia   . Hypertension   . Multiple allergies   . Parkinson's disease ([place])     No past surgical history on file.  There were no vitals filed for this visit.      Subjective Assessment - 05/28/15 1027    Subjective "I'm feeling good today."   Currently in Pain? Yes   Pain Score 3    Pain Location Toe (Comment which one)  L great toe               ADULT SLP TREATMENT - 05/28/15 1029    General Information   Behavior/Cognition Alert;Cooperative;Pleasant mood   Treatment Provided   Treatment provided Cognitive-Linquistic   Cognitive-Linquistic Treatment   Treatment focused on Aphasia;Dysarthria   Skilled Treatment Pt reports dysarthria in the evenings when fatigued. Reviewed compensatory behaviors- pt reports utilizing over-enunciation and using alternate words with success to decrease biting her tongue. Also discussed slowing speech down. No dysarthria noted during the session. Pt able to utilize circumlocution as a strategy for word retrieval episodes with increased time. Occasional min A required to assist with organization of complex information at the conversation level. Increased difficulty noted with increased stress/time constraint.   Assessment / Recommendations / Plan   Plan Continue  with current plan of care   Progression Toward Goals   Progression toward goals Progressing toward goals            SLP Short Term Goals - 05/28/15 1110    SLP SHORT TERM GOAL #1   Title pt will complete HEP with rare min A   Time 3   Period Weeks   SLP SHORT TERM GOAL #2   Title pt will report 25% (subjective) improvement with articulation/speech production in late afternoon/early evenings   Time 3   Period Weeks   Status Partially Met   SLP SHORT TERM GOAL #3   Title pt will demo compensations for dysarthria in 10 minutes simple conversation with rare min A   Time 3   Status Partially Met   SLP SHORT TERM GOAL #4   Title pt will demo compensations for aphasia/verbal expression in 10 minutes simple conversation PRN   Time 3   Status Partially Met   SLP SHORT TERM GOAL #5   Title pt will complete mod complex naming tasks with 85% success   Time 3   Status Partially Met          SLP Long Term Goals - 05/28/15 1112    SLP LONG TERM GOAL #1   Title pt will complete HEP indpeendently   Time 7   SLP LONG TERM GOAL #2   Title pt to demo compensations for verbal expression in 10 minutes mod complex conversation/work-like verbal expression independently  Time 7   SLP LONG TERM GOAL #3   Title pt will demo compensations for dysarthria in 10 minutes mod complex convesation/work like verbal expression independently   Time 77   SLP LONG TERM GOAL #4   Title pt will report (subjectively) an improvement of at least 50% with articulation/speech production later in the day   Time 7          Plan - 05/28/15 1108    Clinical Impression Statement Pt presents with emerging anticipatory awareness in regard to speech and language difficulty. Continue skilled ST for carryover of compensations for dysarthria and aphasia to maximize intelligibility and verbal expression with increased complexity.   Speech Therapy Frequency 2x / week   Treatment/Interventions SLP instruction and  feedback;Compensatory strategies;Patient/family education;Functional tasks;Cueing hierarchy   Potential to Achieve Goals Good   Consulted and Agree with Plan of Care Patient      Patient will benefit from skilled therapeutic intervention in order to improve the following deficits and impairments:   Aphasia  Dysarthria    Problem List Patient Active Problem List   Diagnosis Date Noted  . Tremor 06/17/2013    Vinetta Bergamo MA, CCC-SLP 05/28/2015, 11:15 AM  [facility] [address] [place], [place], [address] Phone: [phone]   Fax:  [phone]   Name: DAYSI BOGGAN MRN: [id number] Date of Birth: [birth year]

## 2015-05-28 NOTE — Therapy (Signed)
[facility] [address] [place], [place], [address] Phone: [phone]   Fax:  [phone]  Physical Therapy Treatment  Patient Details  Name: Laura Lawrence MRN: [id number] Date of Birth: 1955/03/13 Referring Provider: Marjory Lies  Encounter Date: 05/28/2015      PT End of Session - 05/28/15 1143    Visit Number 5   Number of Visits 17   Date for PT Re-Evaluation 06/28/15   Authorization Type [employer]-30 visit limit combined   Authorization - Visit Number 5   Authorization - Number of Visits 10  10 PT visit limt (10 OT and 10 ST)   PT Start Time 0935   PT Stop Time 1015   PT Time Calculation (min) 40 min   Activity Tolerance Patient tolerated treatment well   Behavior During Therapy [facility] for tasks assessed/performed      Past Medical History  Diagnosis Date  . Rheumatoid arthritis (HCC)   . Hypercholesterolemia   . Hypertension   . Multiple allergies   . Parkinson's disease (HCC)     No past surgical history on file.  Filed Vitals:   05/28/15 0941  BP: 117/60  Pulse: 58        Subjective Assessment - 05/28/15 0941    Subjective Pt had her L great toe nail removed Tuesday. Podiatrist told her she could walk and exercise to comfort.   Saw Dr Danae Orleans who was pleased with progress.  Denies falls.  Has not gotten rollator yet.   Pertinent History History of Rheumatoid Arthritis   Patient Stated Goals Pt's goal for therapy is to improve transitional movements-floor, transfers, bed mobility.   Currently in Pain? Yes   Pain Score 3    Pain Location Toe (Comment which one)  L great toe nail removed   Pain Orientation Left   Pain Descriptors / Indicators Sore   Pain Type Acute pain   Pain Onset In the past 7 days   Pain Frequency Constant   Aggravating Factors  had toe nail removed, covers, shoes   Pain Relieving Factors pain meds   Multiple Pain Sites No             [facility] Adult PT  Treatment/Exercise - 05/28/15 0001    Transfers   Transfers Sit to Stand;Stand to Sit   Sit to Stand 5: Supervision   Stand to Sit 5: Supervision   Number of Reps 10 reps   Transfer Cueing verbal and tactile cues for foot placement and forward weight shfit;needing UE assist           [facility]) - 05/28/15 1013    PWR! exercises Moves in sitting   PWR! Up 20   PWR! Rock 20   PWR! Twist 20   PWR! Step 20   Comments min cues for technique             PT Education - 05/28/15 1138    Education provided Yes   Education Details Fall prevention strategies, strategies to reduce freezing   Person(s) Educated Patient   Methods Explanation;Handout   Comprehension Verbalized understanding          PT Short Term Goals - 05/28/15 1148    PT SHORT TERM GOAL #1   Title Pt will be independent with HEP for improved balance, transfers, posture, gait.  TARGET 05/29/15   Time 4   Period Weeks   Status New   PT SHORT TERM GOAL #2   Title Pt will  be able to perform 6 of 10 reps of sit<>stand transfers with minimal UE support, for improved safety and efficiency with transfers.   Time 4   Period Weeks   Status New   PT SHORT TERM GOAL #3   Title Pt will improve TUG socre to less than or equal to 13.5 seconds for decreased fall risk.   Time 4   Period Weeks   Status New   PT SHORT TERM GOAL #4   Title Pt will verbalize/demonstrate tips to reduce freezing episodes/falls with gait and turns.   Time 4   Period Weeks   Status Achieved   PT SHORT TERM GOAL #5   Title Pt will ambulate at least 500 ft using least restrictive assistive device, with supervision for improved safety and efficiency with gait.   Time 4   Period Weeks   Status New           PT Long Term Goals - 04/30/15 2210    PT LONG TERM GOAL #1   Title Pt will report at least 25% improvement in bed mobility.  TARGET 06/28/15   Time 8   Period Weeks   Status New   PT LONG TERM GOAL #2   Title Pt will improve TUG  manual score to less than or equal to 15 seconds for decreased fall risk.   Time 8   Period Weeks   Status New   PT LONG TERM GOAL #3   Title Pt will improve gait velocity to at least 2.62 ft/sec with appropriate assistive device for improved gait efficiency and safety.   Time 8   Period Weeks   Status New   PT LONG TERM GOAL #4   Title Pt will perform floor>stand transfer with UE support with minimal assistance for improved fall recovery.   Time 8   Period Weeks   Status New               Plan - 05/28/15 1144    Clinical Impression Statement Pt needing cues today for PWR! seated for intensity especially with LLE.  Continue PT per POC.   Rehab Potential Good   PT Frequency 2x / week   PT Duration 8 weeks  plus eval; may change due to visit limitations   PT Treatment/Interventions ADLs/Self Care Home Management;Therapeutic exercise;Therapeutic activities;Functional mobility training;Gait training;DME Instruction;Balance training;Neuromuscular re-education;Patient/family education   PT Next Visit Plan begin checking goals   Consulted and Agree with Plan of Care Patient      Patient will benefit from skilled therapeutic intervention in order to improve the following deficits and impairments:  Abnormal gait, Decreased balance, Decreased mobility, Decreased knowledge of use of DME, Decreased strength, Difficulty walking, Postural dysfunction  Visit Diagnosis: Other symptoms and signs involving the nervous system     Problem List Patient Active Problem List   Diagnosis Date Noted  . Tremor 06/17/2013    Newell Coral 05/28/2015, 11:48 AM  [facility] [address] [place], [place], [address] Phone: [phone]   Fax:  [phone]  Name: Laura Lawrence MRN: [id number] Date of Birth: 1955/11/22    Newell Coral, [place] [facility] 05/28/2015 11:48  AM Phone: [phone] Fax: [phone]

## 2015-05-28 NOTE — Patient Instructions (Signed)
Tips to reduce freezing episodes with standing or walking:  1. Stand tall with your feet wide, so that you can rock and weight shift through your hips. 2. Don't try to fight the freeze: if you begin taking slower, faster, smaller steps, STOP, get your posture tall, and RESET your posture and balance.  Take a deep breath before taking the BIG step to start again. 3. March in place, with high knee stepping, to get started walking again. 4. Use auditory cues:  Count out loud, think of a familiar tune or song or cadence, use pocket metronome, to use rhythm to get started walking again. 5. Use visual cues:  Use a line to step over, use laser pointer line to step over, (using BIG steps) to start walking again. 6. Use visual targets to keep your posture tall (look ahead and focus on an object or target at eye level). 7. As you approach where your destination with walking, count your steps out loud and/or focus on your target with your eyes until you are fully there. 8. Use appropriate assistive device, as advised by your physical therapist to assist with taking longer, consistent steps.     Fall Prevention in the Home  Falls can cause injuries and can affect people from all age groups. There are many simple things that you can do to make your home safe and to help prevent falls. WHAT CAN I DO ON THE OUTSIDE OF MY HOME? Regularly repair the edges of walkways and driveways and fix any cracks. Remove high doorway thresholds. Trim any shrubbery on the main path into your home. Use bright outdoor lighting. Clear walkways of debris and clutter, including tools and rocks. Regularly check that handrails are securely fastened and in good repair. Both sides of any steps should have handrails. Install guardrails along the edges of any raised decks or porches. Have leaves, snow, and ice cleared regularly. Use sand or salt on walkways during winter months. In the garage, clean up any spills right away,  including grease or oil spills. WHAT CAN I DO IN THE BATHROOM?  Use night lights.  Install grab bars by the toilet and in the tub and shower. Do not use towel bars as grab bars.  Use non-skid mats or decals on the floor of the tub or shower.  If you need to sit down while you are in the shower, use a plastic, non-slip stool.Marland Kitchen  Keep the floor dry. Immediately clean up any water that spills on the floor.  Remove soap buildup in the tub or shower on a regular basis.  Attach bath mats securely with double-sided non-slip rug tape.  Remove throw rugs and other tripping hazards from the floor. WHAT CAN I DO IN THE BEDROOM?  Use night lights.  Make sure that a bedside light is easy to reach.  Do not use oversized bedding that drapes onto the floor.  Have a firm chair that has side arms to use for getting dressed.  Remove throw rugs and other tripping hazards from the floor. WHAT CAN I DO IN THE KITCHEN?   Clean up any spills right away.  Avoid walking on wet floors.  Place frequently used items in easy-to-reach places.  If you need to reach for something above you, use a sturdy step stool that has a grab bar.  Keep electrical cables out of the way.  Do not use floor polish or wax that makes floors slippery. If you have to use wax, make sure that  it is non-skid floor wax.  Remove throw rugs and other tripping hazards from the floor. WHAT CAN I DO IN THE STAIRWAYS?  Do not leave any items on the stairs.  Make sure that there are handrails on both sides of the stairs. Fix handrails that are broken or loose. Make sure that handrails are as long as the stairways.  Check any carpeting to make sure that it is firmly attached to the stairs. Fix any carpet that is loose or worn.  Avoid having throw rugs at the top or bottom of stairways, or secure the rugs with carpet tape to prevent them from moving.  Make sure that you have a light switch at the top of the stairs and the bottom  of the stairs. If you do not have them, have them installed. WHAT ARE SOME OTHER FALL PREVENTION TIPS?  Wear closed-toe shoes that fit well and support your feet. Wear shoes that have rubber soles or low heels.  When you use a stepladder, make sure that it is completely opened and that the sides are firmly locked. Have someone hold the ladder while you are using it. Do not climb a closed stepladder.  Add color or contrast paint or tape to grab bars and handrails in your home. Place contrasting color strips on the first and last steps.  Use mobility aids as needed, such as canes, walkers, scooters, and crutches.  Turn on lights if it is dark. Replace any light bulbs that burn out.  Set up furniture so that there are clear paths. Keep the furniture in the same spot.  Fix any uneven floor surfaces.  Choose a carpet design that does not hide the edge of steps of a stairway.  Be aware of any and all pets.  Review your medicines with your healthcare provider. Some medicines can cause dizziness or changes in blood pressure, which increase your risk of falling. Talk with your health care provider about other ways that you can decrease your risk of falls. This may include working with a physical therapist or trainer to improve your strength, balance, and endurance.   This information is not intended to replace advice given to you by your health care provider. Make sure you discuss any questions you have with your health care provider.   Document Released: 01/21/2002 Document Revised: 06/17/2014 Document Reviewed: 03/07/2014 Elsevier Interactive Patient Education [employer]  It is important to avoid accidents which may result in broken bones.  Here are a few ideas on how to make your home safer so you will be less likely to trip or fall.  Use nonskid mats or non slip strips in your shower or tub, on your bathroom floor and around sinks.  If you know that you have spilled water, wipe it  up! In the bathroom, it is important to have properly installed grab bars on the walls or on the edge of the tub.  Towel racks are NOT strong enough for you to hold onto or to pull on for support. Stairs and hallways should have enough light.  Add lamps or night lights if you need ore light. It is good to have handrails on both sides of the stairs if possible.  Always fix broken handrails right away. It is important to see the edges of steps.  Paint the edges of outdoor steps white so you can see them better.  Put colored tape on the edge of inside steps. Throw-rugs are dangerous because they can slide.  Removing the rugs is the best idea, but if they must stay, add adhesive carpet tape to prevent slipping. Do not keep things on stairs or in the halls.  Remove small furniture that blocks the halls as it may cause you to trip.  Keep telephone and electrical cords out of the way where you walk. Always were sturdy, rubber-soled shoes for good support.  Never wear just socks, especially on the stairs.  Socks may cause you to slip or fall.  Do not wear full-length housecoats as you can easily trip on the bottom.  Place the things you use the most on the shelves that are the easiest to reach.  If you use a stepstool, make sure it is in good condition.  If you feel unsteady, DO NOT climb, ask for help. If a health professional advises you to use a cane or walker, do not be ashamed.  These items can keep you from falling and breaking your bones.

## 2015-06-02 ENCOUNTER — Ambulatory Visit: Payer: BLUE CROSS/BLUE SHIELD

## 2015-06-02 ENCOUNTER — Ambulatory Visit: Payer: BLUE CROSS/BLUE SHIELD | Admitting: Occupational Therapy

## 2015-06-02 ENCOUNTER — Ambulatory Visit: Payer: BLUE CROSS/BLUE SHIELD | Admitting: Physical Therapy

## 2015-06-02 ENCOUNTER — Telehealth: Payer: Self-pay | Admitting: Speech Pathology

## 2015-06-04 ENCOUNTER — Ambulatory Visit: Payer: BLUE CROSS/BLUE SHIELD | Admitting: Physical Therapy

## 2015-06-04 ENCOUNTER — Ambulatory Visit: Payer: BLUE CROSS/BLUE SHIELD

## 2015-06-04 ENCOUNTER — Ambulatory Visit: Payer: BLUE CROSS/BLUE SHIELD | Admitting: Occupational Therapy

## 2015-06-04 DIAGNOSIS — R29818 Other symptoms and signs involving the nervous system: Secondary | ICD-10-CM

## 2015-06-04 DIAGNOSIS — R29898 Other symptoms and signs involving the musculoskeletal system: Secondary | ICD-10-CM

## 2015-06-04 DIAGNOSIS — R269 Unspecified abnormalities of gait and mobility: Secondary | ICD-10-CM | POA: Diagnosis not present

## 2015-06-04 DIAGNOSIS — R471 Dysarthria and anarthria: Secondary | ICD-10-CM

## 2015-06-04 DIAGNOSIS — R278 Other lack of coordination: Secondary | ICD-10-CM

## 2015-06-04 DIAGNOSIS — R4701 Aphasia: Secondary | ICD-10-CM

## 2015-06-04 DIAGNOSIS — R2689 Other abnormalities of gait and mobility: Secondary | ICD-10-CM

## 2015-06-04 NOTE — Therapy (Signed)
[facility] [address] [place], [place], [address] Phone: [phone]   Fax:  [phone]  Speech Language Pathology Treatment  Patient Details  Name: Laura Lawrence MRN: [id number] Date of Birth: March 26, 1955 Referring Provider: Dr. Leta Baptist  Encounter Date: 06/04/2015      End of Session - 06/04/15 1702    Visit Number 4   Number of Visits 16   Date for SLP Re-Evaluation 07/06/15   SLP Start Time 1449   SLP Stop Time  1530   SLP Time Calculation (min) 41 min   Activity Tolerance Patient tolerated treatment well      Past Medical History  Diagnosis Date  . Rheumatoid arthritis ([place])   . Hypercholesterolemia   . Hypertension   . Multiple allergies   . Parkinson's disease ([place])     No past surgical history on file.  There were no vitals filed for this visit.      Subjective Assessment - 06/04/15 1456    Subjective "She had me talk" (pt, re: last ST session)               ADULT SLP TREATMENT - 06/04/15 1457    General Information   Behavior/Cognition Alert;Cooperative;Pleasant mood   Treatment Provided   Treatment provided Cognitive-Linquistic   Pain Assessment   Pain Assessment 0-10   Pain Score 3    Pain Location lt toe   Pain Descriptors / Indicators Sore   Pain Intervention(s) Premedicated before session   Cognitive-Linquistic Treatment   Treatment focused on Aphasia;Dysarthria   Skilled Treatment Pt confirms her speech is worse in the late PMs and more biting of tongue occurs during those times. SLP reitereated to pt that overarticulation is more important during those times. In  strucutred tasks requiring short sentences, pt maintained intelligibility of 90-100%. SLP cues to exaggerate speech movement was necessary. SLP engaged pt with multiple meaning words, and pt thought of two definitions 100% of the time, 3 definitions (when possible) 4/5 times. Extra time necessary for pt to  produce >1 definition/meaning.   Assessment / Recommendations / Plan   Plan Continue with current plan of care            SLP Short Term Goals - 06/04/15 1706    SLP SHORT TERM GOAL #1   Title pt will complete HEP with rare min A   Time 2   Period Weeks   Status On-going   SLP SHORT TERM GOAL #2   Title pt will report 25% (subjective) improvement with articulation/speech production in late afternoon/early evenings   Time 2   Period Weeks   Status Partially Met   SLP SHORT TERM GOAL #3   Title pt will demo compensations for dysarthria in 10 minutes simple conversation with rare min A   Time 2   Status Partially Met   SLP SHORT TERM GOAL #4   Title pt will demo compensations for aphasia/verbal expression in 10 minutes simple conversation PRN   Time 2   Status Partially Met   SLP SHORT TERM GOAL #5   Title pt will complete mod complex naming tasks with 85% success   Time 2   Status Partially Met          SLP Long Term Goals - 06/04/15 1705    SLP LONG TERM GOAL #1   Title pt will complete HEP indpeendently   Time 6   Period Weeks   Status On-going   SLP LONG  TERM GOAL #2   Title pt to demo compensations for verbal expression in 10 minutes mod complex conversation/work-like verbal expression independently   Time 6   Period Weeks   Status On-going   SLP LONG TERM GOAL #3   Title pt will demo compensations for dysarthria in 10 minutes mod complex convesation/work like verbal expression independently   Time 6   Period Weeks   Status On-going   SLP LONG TERM GOAL #4   Title pt will report (subjectively) an improvement of at least 50% with articulation/speech production later in the day   Time 6   Period Weeks   Status On-going          Plan - 06/04/15 1703    Clinical Impression Statement Mild dysarthria persists, as well as aphasia in more complex situations-extra time needed today for mod compelx linguistic tasks. Continue skilled ST for carryover of  compensations for dysarthria and aphasia to maximize intelligibility and verbal expression with increased complexity.   Speech Therapy Frequency 2x / week   Duration --  7 weeks   Treatment/Interventions SLP instruction and feedback;Compensatory strategies;Patient/family education;Functional tasks;Cueing hierarchy   Potential to Achieve Goals Good   Consulted and Agree with Plan of Care Patient      Patient will benefit from skilled therapeutic intervention in order to improve the following deficits and impairments:   Dysarthria and anarthria  Aphasia    Problem List Patient Active Problem List   Diagnosis Date Noted  . Tremor 06/17/2013    [facility], CCC-SLP  06/04/2015, 5:07 PM  [facility] [address] [place], [place], [address] Phone: [phone]   Fax:  [phone]   Name: Laura Lawrence MRN: [id number] Date of Birth: Dec 05, 1955

## 2015-06-04 NOTE — Addendum Note (Signed)
Addended by: Clinton Sawyer A on: 06/04/2015 11:43 AM   Modules accepted: Orders

## 2015-06-04 NOTE — Therapy (Signed)
[facility] [address] [place], [place], [address] Phone: [phone]   Fax:  [phone]  Physical Therapy Treatment  Patient Details  Name: Laura Lawrence MRN: [id number] Date of Birth: 1955-12-20 Referring Provider: Leta Baptist  Encounter Date: 06/04/2015      PT End of Session - 06/04/15 1505    Visit Number 6   Number of Visits 17   Date for PT Re-Evaluation 06/28/15   Authorization Type BCBS-30 visit limit combined   Authorization - Visit Number 6   Authorization - Number of Visits 10  10 PT visit limt (10 OT and 10 ST)   PT Start Time 1318   PT Stop Time 1400   PT Time Calculation (min) 42 min   Activity Tolerance Patient tolerated treatment well   Behavior During Therapy [facility] for tasks assessed/performed      Past Medical History  Diagnosis Date  . Rheumatoid arthritis ([place])   . Hypercholesterolemia   . Hypertension   . Multiple allergies   . Parkinson's disease ([place])     No past surgical history on file.  There were no vitals filed for this visit.      Subjective Assessment - 06/04/15 1323    Subjective Had to go take care of her mother in law earlier in the week.  Got her Rollator ordered but not here yet.  Got compression hose.  Denies falls.  Got grab bars for her shower and "they are wonderful and really stable."   Pertinent History History of Rheumatoid Arthritis   Patient Stated Goals Pt's goal for therapy is to improve transitional movements-floor, transfers, bed mobility.   Currently in Pain? Yes   Pain Location Toe (Comment which one)  L great toe   Pain Orientation Left   Pain Descriptors / Indicators Sore   Pain Type Acute pain   Pain Onset 1 to 4 weeks ago   Pain Frequency Constant   Aggravating Factors  from toe nail removal   Pain Relieving Factors pain meds             [facility] Adult PT Treatment/Exercise - 06/04/15 0001    Transfers   Transfers Sit to Stand;Stand to  Sit   Sit to Stand 5: Supervision   Five time sit to stand comments  still unable without UE support from 18" chair but able from 20" mat   Stand to Sit 5: Supervision   Number of Reps Other reps (comment)  8 reps without UE support;needs cues to get in position   Ambulation/Gait   Ambulation/Gait Yes   Ambulation/Gait Assistance 5: Supervision   Ambulation Distance (Feet) 550 Feet   Assistive device Rollator   Gait Pattern Poor foot clearance - left;Poor foot clearance - right;Step-through pattern;Decreased arm swing - right;Decreased arm swing - left;Decreased step length - right;Decreased step length - left;Decreased dorsiflexion - right;Decreased dorsiflexion - left   Ambulation Surface Level;Indoor   Standardized Balance Assessment   Standardized Balance Assessment Timed Up and Go Test   Timed Up and Go Test   TUG Normal TUG;Manual TUG;Cognitive TUG   Normal TUG (seconds) 8.5   Manual TUG (seconds) 9.19   Cognitive TUG (seconds) 10.12     Seated PWR! Basic 4 moves x 10-20 reps with cues for intensity with all positions        PT Education - 06/04/15 1505    Education provided Yes   Education Details Progress toward goals, PWR! Moves class   Person(s)  Educated Patient   Methods Explanation;Handout   Comprehension Verbalized understanding          PT Short Term Goals - 06/04/15 1506    PT SHORT TERM GOAL #1   Title Pt will be independent with HEP for improved balance, transfers, posture, gait.  TARGET 05/29/15   Baseline continues to need cues for intensity 06/04/15   Time 4   Period Weeks   Status Partially Met   PT SHORT TERM GOAL #2   Title Pt will be able to perform 6 of 10 reps of sit<>stand transfers with minimal UE support, for improved safety and efficiency with transfers.   Time 4   Period Weeks   Status Achieved   PT SHORT TERM GOAL #3   Title Pt will improve TUG socre to less than or equal to 13.5 seconds for decreased fall risk.   Baseline 8.5 on  06/04/15   Time 4   Period Weeks   Status Achieved   PT SHORT TERM GOAL #4   Title Pt will verbalize/demonstrate tips to reduce freezing episodes/falls with gait and turns.   Time 4   Period Weeks   Status Achieved   PT SHORT TERM GOAL #5   Title Pt will ambulate at least 500 ft using least restrictive assistive device, with supervision for improved safety and efficiency with gait.   Time 4   Period Weeks   Status Achieved           PT Long Term Goals - 04/30/15 2210    PT LONG TERM GOAL #1   Title Pt will report at least 25% improvement in bed mobility.  TARGET 06/28/15   Time 8   Period Weeks   Status New   PT LONG TERM GOAL #2   Title Pt will improve TUG manual score to less than or equal to 15 seconds for decreased fall risk.   Time 8   Period Weeks   Status New   PT LONG TERM GOAL #3   Title Pt will improve gait velocity to at least 2.62 ft/sec with appropriate assistive device for improved gait efficiency and safety.   Time 8   Period Weeks   Status New   PT LONG TERM GOAL #4   Title Pt will perform floor>stand transfer with UE support with minimal assistance for improved fall recovery.   Time 8   Period Weeks   Status New               Plan - 06/04/15 1507    Clinical Impression Statement STG 1 partially met as pt continues to need cues for technique and intensity.  STG 2-5 met.  Continue PT per POC.   Rehab Potential Good   PT Frequency 2x / week   PT Duration 8 weeks  plus eval; may change due to visit limitations   PT Treatment/Interventions ADLs/Self Care Home Management;Therapeutic exercise;Therapeutic activities;Functional mobility training;Gait training;DME Instruction;Balance training;Neuromuscular re-education;Patient/family education   PT Next Visit Plan Continue to encourage intensity with HEP, gait indoors/outdoors during functional tasks, balance.   Consulted and Agree with Plan of Care Patient      Patient will benefit from skilled  therapeutic intervention in order to improve the following deficits and impairments:  Abnormal gait, Decreased balance, Decreased mobility, Decreased knowledge of use of DME, Decreased strength, Difficulty walking, Postural dysfunction  Visit Diagnosis: Other abnormalities of gait and mobility  Other symptoms and signs involving the nervous system     Problem  List Patient Active Problem List   Diagnosis Date Noted  . Tremor 06/17/2013    Narda Bonds 06/04/2015, 3:09 PM  [facility] [address] [place], [place], [address] Phone: [phone]   Fax:  [phone]  Name: JOSETTA WIGAL MRN: [id number] Date of Birth: August 13, 1955    Einar Grad Turner 06/04/2015 3:09 PM Phone: [phone] Fax: [phone]

## 2015-06-05 NOTE — Therapy (Signed)
[facility] [address] [place], [place], [address] Phone: [phone]   Fax:  [phone]  Occupational Therapy Treatment  Patient Details  Name: Laura Lawrence MRN: [id number] Date of Birth: Jan 22, 1956 Referring Provider: Dr. Marjory Lies  Encounter Date: 06/04/2015      OT End of Session - 06/04/15 1406    Visit Number 5   Number of Visits 17   Date for OT Re-Evaluation 06/29/15   Authorization Type BCBS   Authorization - Visit Number 5   Authorization - Number of Visits 10   OT Start Time 1405   OT Stop Time 1445   OT Time Calculation (min) 40 min   Activity Tolerance Patient tolerated treatment well   Behavior During Therapy [facility] for tasks assessed/performed      Past Medical History  Diagnosis Date  . Rheumatoid arthritis ([facility])   . Hypercholesterolemia   . Hypertension   . Multiple allergies   . Parkinson's disease ([facility])     No past surgical history on file.  There were no vitals filed for this visit.      Subjective Assessment - 06/04/15 1413    Subjective  Pt reports toe pain   Pertinent History see Epic    Patient Stated Goals increased ease with ADLs   Currently in Pain? Yes   Pain Score 3    Pain Location Toe (Comment which one)   Pain Orientation Left   Pain Descriptors / Indicators Sore   Pain Type Acute pain   Pain Onset 1 to 4 weeks ago   Aggravating Factors  to nail removal   Pain Relieving Factors pain meds   Multiple Pain Sites No           Pt/ therapist discussed adapted strategies for ADLs(ie use a seat in shower when washing hair) for energy conservation and decreased fall risk and . Use of foot stool for LB dressing.   Pt to bring in compression hose for practice using TED donning device. Therapist reviewed previously  issued exercises and disposed of non relevant exercises, and organized in notebook with pt. Therapist recommends PWR! Modified quadraped 5 x week                [facility]) - 06/05/15 1713    PWR! exercises Moves in [place]! Up 10   PWR! Rock 10   PWR! Twist 10   PWR! Step 10   Comments modified quadraped, min-mod v.c. for technique, larger amplitude movements             OT Education - 06/05/15 1714    Education provided Yes   Education Details PWR! modified quadraped   Person(s) Educated Patient   Methods Explanation;Demonstration;Verbal cues;Handout   Comprehension Verbalized understanding          OT Short Term Goals - 04/29/15 1655    OT SHORT TERM GOAL #1   Title Pt will be independent with HEP    Time 4   Period Weeks   Status New   OT SHORT TERM GOAL #2   Title Pt will reduce simluated dressing task on Physical Performance test #4 by 5 seconds    Baseline 40.67   Time 4   Period Weeks   Status New   OT SHORT TERM GOAL #3   Title Pt will verbalize understanding of adapted strategies for ADLS/IADLS.   Time 4   Period Weeks   Status New   OT SHORT  TERM GOAL #4   Title Pt will verbalize understanding of compensatory strategies for short term memory deficits and ways to keep thinking skills sharp.   Time 4   Period Weeks   Status New   OT SHORT TERM GOAL #5   Title Pt will verbalize understanding of community resources and ways to prevent future PD related complications.   Time 4   Period Weeks   Status New           OT Long Term Goals - 04/29/15 1657    OT LONG TERM GOAL #1   Title Pt will decrease time on 3 button/ unbutton task to 26 sec or less   Baseline 31.09   Time 8   Period Weeks   Status New   OT LONG TERM GOAL #2   Title Pt will decrease 9 hole peg test by 3 seconds for LUE   Baseline 37.10   Time 8   Period Weeks   Status New   OT LONG TERM GOAL #3   Title Pt will decrease simulated dressing task on Physical Peformance test by 10 seconds    Baseline 40.67   Time 8   Period Weeks   OT LONG TERM GOAL #4   Title Pt will decrease simluated eating task on  Physical Performance Test by 3 seconds    Baseline 13.90   Time 8   Period Weeks   Status New   OT LONG TERM GOAL #5   Title Pt will retrieve a lightweight object from overhead shelf with -15 elbow extension and pain no greater then 3/10 (for bilaterally UE's tested individually)   Baseline RUE elbow -20, LUE elbow -25   Time 8   Period Weeks   Status New   OT LONG TERM GOAL #6   Title --------------------------------------------------------------------------------------------------------------------------   Status New               Plan - 06/05/15 1710    Clinical Impression Statement Pt is progressing towards goals for ADLS and PD specific HEP.   Rehab Potential Good   OT Frequency 2x / week   OT Duration 8 weeks   OT Treatment/Interventions Self-care/ADL training;Therapeutic exercise;Functional Mobility Training;Patient/family education;Balance training;Splinting;Manual Therapy;Ultrasound;Neuromuscular education;Energy conservation;Therapeutic exercises;Therapeutic activities;DME and/or AE instruction;Parrafin;Cryotherapy;Electrical Stimulation;Fluidtherapy;Gait Training;Scar mobilization;Cognitive remediation/compensation;Passive range of motion;Visual/perceptual remediation/compensation;Contrast Bath;Moist Heat   Plan reinforce PWr! modified quadraped, adapted strategies for ADLs.   OT Home Exercise Plan Education Provided:  issued PWR! seated; coordination HEP, PWR! hands (basic 4)   Consulted and Agree with Plan of Care Patient      Patient will benefit from skilled therapeutic intervention in order to improve the following deficits and impairments:  Abnormal gait, Decreased coordination, Decreased range of motion, Difficulty walking, Impaired flexibility, Increased edema, Decreased safety awareness, Decreased endurance, Decreased activity tolerance, Pain, Impaired UE functional use, Decreased knowledge of use of DME, Decreased balance, Decreased cognition, Decreased  mobility, Decreased strength  Visit Diagnosis: Other symptoms and signs involving the nervous system  Other symptoms and signs involving the musculoskeletal system  Other lack of coordination    Problem List Patient Active Problem List   Diagnosis Date Noted  . Tremor 06/17/2013    Rosha Cocker 06/05/2015, 5:15 PM [place] Breath, OTR/L [phone] Phone: [phone] 5:15 PM 06/05/2015 [facility] [address] [place], [place], [address] Phone: [phone]   Fax:  [phone]  Name: Laura Lawrence MRN: [id number] Date of Birth: [birth year]

## 2015-06-09 ENCOUNTER — Ambulatory Visit: Payer: BLUE CROSS/BLUE SHIELD | Admitting: Physical Therapy

## 2015-06-09 ENCOUNTER — Ambulatory Visit: Payer: BLUE CROSS/BLUE SHIELD | Admitting: Occupational Therapy

## 2015-06-09 ENCOUNTER — Ambulatory Visit: Payer: BLUE CROSS/BLUE SHIELD | Admitting: Speech Pathology

## 2015-06-09 DIAGNOSIS — R4701 Aphasia: Secondary | ICD-10-CM

## 2015-06-09 DIAGNOSIS — R293 Abnormal posture: Secondary | ICD-10-CM

## 2015-06-09 DIAGNOSIS — R29818 Other symptoms and signs involving the nervous system: Secondary | ICD-10-CM

## 2015-06-09 DIAGNOSIS — R278 Other lack of coordination: Secondary | ICD-10-CM

## 2015-06-09 DIAGNOSIS — R29898 Other symptoms and signs involving the musculoskeletal system: Secondary | ICD-10-CM

## 2015-06-09 DIAGNOSIS — R471 Dysarthria and anarthria: Secondary | ICD-10-CM

## 2015-06-09 DIAGNOSIS — R2689 Other abnormalities of gait and mobility: Secondary | ICD-10-CM

## 2015-06-09 DIAGNOSIS — R269 Unspecified abnormalities of gait and mobility: Secondary | ICD-10-CM | POA: Diagnosis not present

## 2015-06-09 NOTE — Therapy (Signed)
[facility] [address] [place], [place], [address] Phone: [phone]   Fax:  [phone]  Occupational Therapy Treatment  Patient Details  Name: Laura Lawrence MRN: [id number] Date of Birth: 01-Apr-1955 Referring Provider: Dr. Marjory Lies  Encounter Date: 06/09/2015      OT End of Session - 06/09/15 1157    Visit Number 6   Number of Visits 17   Date for OT Re-Evaluation 06/29/15   Authorization Type BCBS   Authorization - Visit Number 6   Authorization - Number of Visits 10   OT Start Time 1147   OT Stop Time 1230   OT Time Calculation (min) 43 min   Activity Tolerance Patient tolerated treatment well   Behavior During Therapy Laura Lawrence for tasks assessed/performed      Past Medical History  Diagnosis Date  . Rheumatoid arthritis ([facility])   . Hypercholesterolemia   . Hypertension   . Multiple allergies   . Parkinson's disease ([facility])     No past surgical history on file.  There were no vitals filed for this visit.          fine motor coordination task placing / removing grooved pegs with RUE using PWR! Hands, and PWR! Step min -mod difficulty/ v.c.             P[facility]) - 06/09/15 1217    PWR! exercises Moves in [place];Moves in prone   PWR! Up 15   PWR! Rock 15   PWR! Twist 10   PWR! Step 10   Comments ,    PWR! Up 10   PWR! Rock 10   PWR! Twist 10 to each side   PWR! Step 10   Comments mod v.c./ demonstration               OT Short Term Goals - 04/29/15 1655    OT SHORT TERM GOAL #1   Title Pt will be independent with HEP    Time 4   Period Weeks   Status New   OT SHORT TERM GOAL #2   Title Pt will reduce simluated dressing task on Physical Performance test #4 by 5 seconds    Baseline 40.67   Time 4   Period Weeks   Status New   OT SHORT TERM GOAL #3   Title Pt will verbalize understanding of adapted strategies for ADLS/IADLS.   Time 4   Period Weeks   Status  New   OT SHORT TERM GOAL #4   Title Pt will verbalize understanding of compensatory strategies for short term memory deficits and ways to keep thinking skills sharp.   Time 4   Period Weeks   Status New   OT SHORT TERM GOAL #5   Title Pt will verbalize understanding of community resources and ways to prevent future PD related complications.   Time 4   Period Weeks   Status New           OT Long Term Goals - 04/29/15 1657    OT LONG TERM GOAL #1   Title Pt will decrease time on 3 button/ unbutton task to 26 sec or less   Baseline 31.09   Time 8   Period Weeks   Status New   OT LONG TERM GOAL #2   Title Pt will decrease 9 hole peg test by 3 seconds for LUE   Baseline 37.10   Time 8   Period Weeks   Status New  OT LONG TERM GOAL #3   Title Pt will decrease simulated dressing task on Physical Peformance test by 10 seconds    Baseline 40.67   Time 8   Period Weeks   OT LONG TERM GOAL #4   Title Pt will decrease simluated eating task on Physical Performance Test by 3 seconds    Baseline 13.90   Time 8   Period Weeks   Status New   OT LONG TERM GOAL #5   Title Pt will retrieve a lightweight object from overhead shelf with -15 elbow extension and pain no greater then 3/10 (for bilaterally UE's tested individually)   Baseline RUE elbow -20, LUE elbow -25   Time 8   Period Weeks   Status New   OT LONG TERM GOAL #6   Title --------------------------------------------------------------------------------------------------------------------------   Status New               Plan - 06/09/15 1206    Clinical Impression Statement Pt is progressing towards goals. She demonstrates understanding of PD specific HEP, yet can benefit from reinforcement.   Rehab Potential Good   OT Frequency 2x / week   OT Duration 8 weeks   OT Treatment/Interventions Self-care/ADL training;Therapeutic exercise;Functional Mobility Training;Patient/family education;Balance  training;Splinting;Manual Therapy;Ultrasound;Neuromuscular education;Energy conservation;Therapeutic exercises;Therapeutic activities;DME and/or AE instruction;Parrafin;Cryotherapy;Electrical Stimulation;Fluidtherapy;Gait Training;Scar mobilization;Cognitive remediation/compensation;Passive range of motion;Visual/perceptual remediation/compensation;Contrast Bath;Moist Heat   Plan reinforce PWR! prone PRN, fine motor coordination   Consulted and Agree with Plan of Care Patient      Patient will benefit from skilled therapeutic intervention in order to improve the following deficits and impairments:  Abnormal gait, Decreased coordination, Decreased range of motion, Difficulty walking, Impaired flexibility, Increased edema, Decreased safety awareness, Decreased endurance, Decreased activity tolerance, Pain, Impaired UE functional use, Decreased knowledge of use of DME, Decreased balance, Decreased cognition, Decreased mobility, Decreased strength  Visit Diagnosis: Other symptoms and signs involving the nervous system  Other symptoms and signs involving the musculoskeletal system  Other lack of coordination  Abnormal posture    Problem List Patient Active Problem List   Diagnosis Date Noted  . Tremor 06/17/2013    Laura Lawrence 06/09/2015, 12:34 PM  [facility] [address] [place], [place], [address] Phone: [phone]   Fax:  [phone]  Name: Laura [facility] MRN: [id number] Date of Birth: 1955/11/10

## 2015-06-09 NOTE — Therapy (Signed)
[facility] [address] [place], [place], [address] Phone: [phone]   Fax:  [phone]  Physical Therapy Treatment  Patient Details  Name: Laura Lawrence MRN: [id number] Date of Birth: 1955/02/21 Referring Provider: Leta Baptist  Encounter Date: 06/09/2015      PT End of Session - 06/09/15 2213    Visit Number 7   Number of Visits 17   Date for PT Re-Evaluation 06/28/15   Authorization Type BCBS-30 visit limit combined   Authorization - Visit Number 7   Authorization - Number of Visits 10  10 PT visit (each PT and OT)   PT Start Time 1104   PT Stop Time 1146   PT Time Calculation (min) 42 min   Equipment Utilized During Treatment Gait belt   Activity Tolerance Patient tolerated treatment well   Behavior During Therapy [facility] for tasks assessed/performed      Past Medical History  Diagnosis Date  . Rheumatoid arthritis ([place])   . Hypercholesterolemia   . Hypertension   . Multiple allergies   . Parkinson's disease ([place])     No past surgical history on file.  There were no vitals filed for this visit.      Subjective Assessment - 06/09/15 1110    Subjective Got the rollator and used it yesterday to see mother in law in hospital.  The Neupro patch seems to be helping.  Swelling in legs goes down when I prop up my legs.   Patient Stated Goals Pt's goal for therapy is to improve transitional movements-floor, transfers, bed mobility.   Currently in Pain? No/denies                         [facility] Adult PT Treatment/Exercise - 06/09/15 1112    Transfers   Transfers Sit to Stand;Stand to Sit   Sit to Stand 5: Supervision;With upper extremity assist;With armrests;From chair/3-in-1   Stand to Sit 5: Supervision;With upper extremity assist;With armrests;To chair/3-in-1   Number of Reps 10 reps;2 sets  from 18" chair, with cues for initial forward lean   Transfer Cueing initial cues for forward  lean/momentum, cues to stand erect upon standing   Comments Pt able to self-correct when she isn't in the correct position for sit<>stand.   Ambulation/Gait   Ambulation/Gait Yes   Ambulation/Gait Assistance 5: Supervision   Ambulation Distance (Feet) 350 Feet  then 400, then 250 ft around furniture   Assistive device Rollator   Gait Pattern Poor foot clearance - left;Poor foot clearance - right;Decreased dorsiflexion - right;Decreased dorsiflexion - left  2 episodes of foot catching in 350 ft.   Ambulation Surface Level;Indoor   Pre-Gait Activities Standing lateral weightshifting x 10 reps, then stagger stance forward/back weightshifting forward/back, x 15 reps at counter.  Seated noodle rolling with L foot for active stretch based on pt's reports of L toes curling wtih marching activity.   Gait Comments Initial cues for heelstrike, increased step length (feet under the seat of the walker); pt has 2 episodes of foot catching with slowed pace of gait.      Marching in place at walker 2 sets x 10 reps, for improved initiation of gait activities.  Gait at beginning of session with rollator x 120 ft. With supervision.  Therapist adjusts height of rollator appropriately prior to further gait.          PT Education - 06/09/15 2212    Education provided Yes  Education Details seated foot/toe stretch with pool noodle/tennis ball to address dystonia/toes curling with standing activities   Person(s) Educated Patient   Methods Explanation;Demonstration   Comprehension Verbalized understanding          PT Short Term Goals - 06/04/15 1506    PT SHORT TERM GOAL #1   Title Pt will be independent with HEP for improved balance, transfers, posture, gait.  TARGET 05/29/15   Baseline continues to need cues for intensity 06/04/15   Time 4   Period Weeks   Status Partially Met   PT SHORT TERM GOAL #2   Title Pt will be able to perform 6 of 10 reps of sit<>stand transfers with minimal UE support,  for improved safety and efficiency with transfers.   Time 4   Period Weeks   Status Achieved   PT SHORT TERM GOAL #3   Title Pt will improve TUG socre to less than or equal to 13.5 seconds for decreased fall risk.   Baseline 8.5 on 06/04/15   Time 4   Period Weeks   Status Achieved   PT SHORT TERM GOAL #4   Title Pt will verbalize/demonstrate tips to reduce freezing episodes/falls with gait and turns.   Time 4   Period Weeks   Status Achieved   PT SHORT TERM GOAL #5   Title Pt will ambulate at least 500 ft using least restrictive assistive device, with supervision for improved safety and efficiency with gait.   Time 4   Period Weeks   Status Achieved           PT Long Term Goals - 04/30/15 2210    PT LONG TERM GOAL #1   Title Pt will report at least 25% improvement in bed mobility.  TARGET 06/28/15   Time 8   Period Weeks   Status New   PT LONG TERM GOAL #2   Title Pt will improve TUG manual score to less than or equal to 15 seconds for decreased fall risk.   Time 8   Period Weeks   Status New   PT LONG TERM GOAL #3   Title Pt will improve gait velocity to at least 2.62 ft/sec with appropriate assistive device for improved gait efficiency and safety.   Time 8   Period Weeks   Status New   PT LONG TERM GOAL #4   Title Pt will perform floor>stand transfer with UE support with minimal assistance for improved fall recovery.   Time 8   Period Weeks   Status New               Plan - 06/09/15 2214    Clinical Impression Statement Pt has received and is using her own rollator walker.  Pt appears to have good control and use of rollator during session today, but she does experience several episodes of foot catching with gait.  She appears to have improved awareness of movement patterns and with gait, even with conversation tasks.  Pt has had to cancel several appoitnments due to mother-in-law's illness in [place].  Pt will continue to benefit from further skilled PT to  address balance, gait and transfers, with emphasis on gait safety and decreased fall risk.   Rehab Potential Good   PT Frequency 2x / week   PT Duration 8 weeks  plus eval; may change due to visit limitations   PT Treatment/Interventions ADLs/Self Care Home Management;Therapeutic exercise;Therapeutic activities;Functional mobility training;Gait training;DME Instruction;Balance training;Neuromuscular re-education;Patient/family education   PT Next  Visit Plan Discuss visit limit with patient; review HEP as needed; gait indoors and outdoors with rollator including conversation and environmental scanning tasks.   Consulted and Agree with Plan of Care Patient      Patient will benefit from skilled therapeutic intervention in order to improve the following deficits and impairments:  Abnormal gait, Decreased balance, Decreased mobility, Decreased knowledge of use of DME, Decreased strength, Difficulty walking, Postural dysfunction  Visit Diagnosis: Other abnormalities of gait and mobility     Problem List Patient Active Problem List   Diagnosis Date Noted  . Tremor 06/17/2013    Shazia Mitchener W. 06/09/2015, 10:27 PM  Frazier Butt., PT  [facility] [address] [place], [place], [address] Phone: [phone]   Fax:  [phone]  Name: ALYXIS GRIPPI MRN: [id number] Date of Birth: [birth year]

## 2015-06-09 NOTE — Therapy (Signed)
[facility] [address] [place], [place], [address] Phone: [phone]   Fax:  [phone]  Speech Language Pathology Treatment  Patient Details  Name: Laura Lawrence MRN: [id number] Date of Birth: [birth year] Referring Provider: Dr. Leta Baptist  Encounter Date: 06/09/2015      End of Session - 06/09/15 1314    Visit Number 5   Number of Visits 16   Date for SLP Re-Evaluation 07/06/15   SLP Start Time 3532   SLP Stop Time  1316   SLP Time Calculation (min) 43 min   Activity Tolerance Patient tolerated treatment well      Past Medical History  Diagnosis Date  . Rheumatoid arthritis ([place])   . Hypercholesterolemia   . Hypertension   . Multiple allergies   . Parkinson's disease ([place])     No past surgical history on file.  There were no vitals filed for this visit.      Subjective Assessment - 06/09/15 1312    Subjective "I have not rested since my mother in law has been in the hospital and it has affected by speech"   Currently in Pain? No/denies               ADULT SLP TREATMENT - 06/09/15 1240    General Information   Behavior/Cognition Alert;Cooperative;Pleasant mood   Treatment Provided   Treatment provided Cognitive-Linquistic   Pain Assessment   Pain Assessment No/denies pain   Cognitive-Linquistic Treatment   Treatment focused on Aphasia;Dysarthria   Skilled Treatment Reviewed homework - pt reported it was realtively easy. Facilitated  compensations for aphasia  with high level describing dog breeds for ST to guess with 90% success.  Slight dysarthria noted today - pt is in agreement. Compensatins for dysarthria faciliated with multisyllabic word repetition then putting them in a sentence with rare min A for slow rate and overarticulation. Pt required extended time for sentence generation.    Assessment / Recommendations / Plan   Plan Continue with current plan of care   Progression Toward  Goals   Progression toward goals Progressing toward goals            SLP Short Term Goals - 06/09/15 1310    SLP SHORT TERM GOAL #1   Title pt will complete HEP with rare min A   Time 2   Period Weeks   Status Achieved   SLP SHORT TERM GOAL #2   Title pt will report 25% (subjective) improvement with articulation/speech production in late afternoon/early evenings   Time 2   Period Weeks   Status Partially Met   SLP SHORT TERM GOAL #3   Title pt will demo compensations for dysarthria in 10 minutes simple conversation with rare min A   Time 2   Status Achieved   SLP SHORT TERM GOAL #4   Title pt will demo compensations for aphasia/verbal expression in 10 minutes simple conversation PRN   Time 2   Status Partially Met   SLP SHORT TERM GOAL #5   Title pt will complete mod complex naming tasks with 85% success   Time 2   Status Partially Met          SLP Long Term Goals - 06/09/15 1312    SLP LONG TERM GOAL #1   Title pt will complete HEP indpeendently   Time 5   Period Weeks   Status On-going   SLP LONG TERM GOAL #2   Title pt to demo compensations  for verbal expression in 10 minutes mod complex conversation/work-like verbal expression independently   Time 5   Period Weeks   Status On-going   SLP LONG TERM GOAL #3   Title pt will demo compensations for dysarthria in 10 minutes mod complex convesation/work like verbal expression independently   Time 5   Period Weeks   Status On-going   SLP LONG TERM GOAL #4   Title pt will report (subjectively) an improvement of at least 50% with articulation/speech production later in the day   Time 5   Period Weeks   Status On-going          Plan - 06/09/15 1303    Clinical Impression Statement Min cues to use compensations for dysarthria and aphasia. Continue skilled ST to maximize intelligibility and high level verbal expression.   Speech Therapy Frequency 2x / week   Treatment/Interventions SLP instruction and  feedback;Compensatory strategies;Patient/family education;Functional tasks;Cueing hierarchy   Potential to Achieve Goals Good   Consulted and Agree with Plan of Care Patient      Patient will benefit from skilled therapeutic intervention in order to improve the following deficits and impairments:   Dysarthria and anarthria  Aphasia    Problem List Patient Active Problem List   Diagnosis Date Noted  . Tremor 06/17/2013    Zettie Gootee, Annye Rusk MS, CCC-SLP 06/09/2015, 1:18 PM  [facility] [address] [place], [place], [address] Phone: [phone]   Fax:  [phone]   Name: Laura Lawrence MRN: [id number] Date of Birth: 1955-08-19

## 2015-06-11 ENCOUNTER — Ambulatory Visit: Payer: BLUE CROSS/BLUE SHIELD | Admitting: Physical Therapy

## 2015-06-11 ENCOUNTER — Encounter: Payer: BLUE CROSS/BLUE SHIELD | Admitting: Occupational Therapy

## 2015-06-16 ENCOUNTER — Ambulatory Visit: Payer: BLUE CROSS/BLUE SHIELD

## 2015-06-16 ENCOUNTER — Ambulatory Visit: Payer: BLUE CROSS/BLUE SHIELD | Admitting: Occupational Therapy

## 2015-06-16 ENCOUNTER — Ambulatory Visit: Payer: BLUE CROSS/BLUE SHIELD | Attending: Diagnostic Neuroimaging | Admitting: Physical Therapy

## 2015-06-16 DIAGNOSIS — R278 Other lack of coordination: Secondary | ICD-10-CM | POA: Diagnosis present

## 2015-06-16 DIAGNOSIS — R471 Dysarthria and anarthria: Secondary | ICD-10-CM

## 2015-06-16 DIAGNOSIS — R29818 Other symptoms and signs involving the nervous system: Secondary | ICD-10-CM | POA: Diagnosis present

## 2015-06-16 DIAGNOSIS — R29898 Other symptoms and signs involving the musculoskeletal system: Secondary | ICD-10-CM | POA: Insufficient documentation

## 2015-06-16 DIAGNOSIS — R4701 Aphasia: Secondary | ICD-10-CM | POA: Diagnosis present

## 2015-06-16 DIAGNOSIS — R293 Abnormal posture: Secondary | ICD-10-CM

## 2015-06-16 DIAGNOSIS — R2689 Other abnormalities of gait and mobility: Secondary | ICD-10-CM | POA: Diagnosis present

## 2015-06-16 NOTE — Patient Instructions (Signed)
  Please complete the assigned speech therapy homework and return it to your next session.  

## 2015-06-16 NOTE — Therapy (Signed)
[facility] [address] [place], [place], [address] Phone: [phone]   Fax:  [phone]  Occupational Therapy Evaluation  Patient Details  Name: Laura Lawrence MRN: [id number] Date of Birth: 10/02/1955 Referring Provider: Dr. Marjory Lies  Encounter Date: 06/16/2015      OT End of Session - 06/16/15 1406    Visit Number 7   Number of Visits 17   Date for OT Re-Evaluation 06/29/15   Authorization Type BCBS   Authorization - Visit Number 7   Authorization - Number of Visits 10   OT Start Time 1405   OT Stop Time 1445   OT Time Calculation (min) 40 min   Activity Tolerance Patient tolerated treatment well   Behavior During Therapy [facility] for tasks assessed/performed      Past Medical History  Diagnosis Date  . Rheumatoid arthritis ([facility])   . Hypercholesterolemia   . Hypertension   . Multiple allergies   . Parkinson's disease ([facility])     No past surgical history on file.  There were no vitals filed for this visit.      Subjective Assessment - 06/16/15 1405    Subjective  Pt reports mother-in-law passed away last week.  "I had to do buttons and hose.  I could not do the buttons.  When I used the walker it was better, but when I didn't, I had back pain"   Pertinent History see Epic    Patient Stated Goals increased ease with ADLs   Currently in Pain? Yes   Pain Score 3    Pain Location Back   Pain Orientation Lower;Right   Pain Descriptors / Indicators Sore   Pain Type Acute pain   Pain Frequency Intermittent   Aggravating Factors  ?   Pain Relieving Factors tylenol         Neuro re-ed:  PWR! Moves (basic 4) in prone x 10-20 each with min cues For incr movement amplitude.    Sliding cards across table using PWR! Hands/finger extension with min cueing for large amplitude movements with each UE, mod difficulty.  Self Care:    Dressing:   Practiced buttoning/unbuttoning shirt on table top with min  cues for use of PWR! Hands prior to buttoning and use of deliberate/large amplitude movements after instruction.  Pt demo improvement with repetition and use of large amplitude movements.  Then practiced with button hook after initial instruction.                  OT Treatments/Exercises (OP) - 06/16/15 0001    Fine Motor Coordination   Fine Motor Coordination Grooved pegs   Grooved pegs placing grooved pegs in pegboard with each hand with use/min cues for PWR! Hands and min difficulty               OT Education - 06/16/15 1441    Education Details button hook use for smaller buttons or buttons in tight spaces   Person(s) Educated Patient   Methods Explanation;Verbal cues;Demonstration   Comprehension Verbalized understanding;Returned demonstration          OT Short Term Goals - 06/16/15 1446    OT SHORT TERM GOAL #1   Title Pt will be independent with HEP    Time 4   Period Weeks   Status On-going   OT SHORT TERM GOAL #2   Title Pt will reduce simluated dressing task on Physical Performance test #4 by 5 seconds    Baseline 40.67  Time 4   Period Weeks   Status New   OT SHORT TERM GOAL #3   Title Pt will verbalize understanding of adapted strategies for ADLS/IADLS.   Time 4   Period Weeks   Status On-going   OT SHORT TERM GOAL #4   Title Pt will verbalize understanding of compensatory strategies for short term memory deficits and ways to keep thinking skills sharp.   Time 4   Period Weeks   Status New   OT SHORT TERM GOAL #5   Title Pt will verbalize understanding of community resources and ways to prevent future PD related complications.   Time 4   Period Weeks   Status New           OT Long Term Goals - 04/29/15 1657    OT LONG TERM GOAL #1   Title Pt will decrease time on 3 button/ unbutton task to 26 sec or less   Baseline 31.09   Time 8   Period Weeks   Status New   OT LONG TERM GOAL #2   Title Pt will decrease 9 hole peg test by  3 seconds for LUE   Baseline 37.10   Time 8   Period Weeks   Status New   OT LONG TERM GOAL #3   Title Pt will decrease simulated dressing task on Physical Peformance test by 10 seconds    Baseline 40.67   Time 8   Period Weeks   OT LONG TERM GOAL #4   Title Pt will decrease simluated eating task on Physical Performance Test by 3 seconds    Baseline 13.90   Time 8   Period Weeks   Status New   OT LONG TERM GOAL #5   Title Pt will retrieve a lightweight object from overhead shelf with -15 elbow extension and pain no greater then 3/10 (for bilaterally UE's tested individually)   Baseline RUE elbow -20, LUE elbow -25   Time 8   Period Weeks   Status New   OT LONG TERM GOAL #6   Title --------------------------------------------------------------------------------------------------------------------------   Status New               Plan - 06/16/15 1443    Clinical Impression Statement Pt is progressing towards goals.  She demo improving coordination for ADLs and improved buttoning with strategies.   Plan check STGs next visit   OT Home Exercise Plan Education Provided:  issued PWR! seated; coordination HEP, PWR! hands (basic 4)   Consulted and Agree with Plan of Care Patient      Patient will benefit from skilled therapeutic intervention in order to improve the following deficits and impairments:     Visit Diagnosis: Other symptoms and signs involving the nervous system  Other symptoms and signs involving the musculoskeletal system  Other lack of coordination  Abnormal posture    Problem List Patient Active Problem List   Diagnosis Date Noted  . Tremor 06/17/2013    [facility] 06/16/2015, 5:41 PM  [facility] [address] [place], [place], [address] Phone: [phone]   Fax:  [phone]  Name: Laura Lawrence MRN: [id number] Date of Birth: [birth year]  Willa Frater, OTR/L [facility] [address] [place], [place]  [phone] phone [phone] 06/16/2015 5:41 PM

## 2015-06-16 NOTE — Patient Instructions (Signed)
Turning in Place: Solid Surface    Standing in place at the counter, lead with head and turn slowly making quarter turns toward left. Repeat _3___ times clockwise, then 3 times counterclockwise per session. Do _2-3___ sessions per day. (you will also want to do this type of turn when you have been standing/stopped for a while, and need to start walking by changing directions or turning.)  -If you do this with your walker, make sure to keep your feet near the wheels, so you are stepping within the base of support of the walker.  Copyright  VHI. All rights reserved.  Marching in Place: Varied Surfaces    March in place, slowly lifting knees toward ceiling. Use marching turns with your walker, like stomping, to turn in tight spaces.  Practice 3 times clockwise, 3 times counter clockwise 1-2 times per day.  Copyright  VHI. All rights reserved.  Single Step: Side    Lifting foot off floor, take one step slowly to left side. Return to starting position.  You will want to use this method when you are using your walker and you want to pick up something and place it on the seat of the walker for improved carrying.  Lock the brakes, with the walker slightly in front of you and take a side step as you reach out for the object.  Then step back towards the walker to prepare to start walking again. Copyright  VHI. All rights reserved.

## 2015-06-16 NOTE — Therapy (Signed)
[facility] [address] [place], [place], [address] Phone: [phone]   Fax:  [phone]  Speech Language Pathology Treatment  Patient Details  Name: DESHA BITNER MRN: [id number] Date of Birth: 10-May-1955 Referring Provider: Dr. Leta Baptist  Encounter Date: 06/16/2015      End of Session - 06/16/15 1458    Visit Number 6   Number of Visits 16   Date for SLP Re-Evaluation 07/06/15   SLP Start Time 1448   SLP Stop Time  1528   SLP Time Calculation (min) 40 min   Activity Tolerance Patient tolerated treatment well      Past Medical History  Diagnosis Date  . Rheumatoid arthritis ([place])   . Hypercholesterolemia   . Hypertension   . Multiple allergies   . Parkinson's disease ([place])     No past surgical history on file.  There were no vitals filed for this visit.             ADULT SLP TREATMENT - 06/16/15 1459    General Information   Behavior/Cognition Alert;Cooperative;Pleasant mood   Treatment Provided   Treatment provided Cognitive-Linquistic   Pain Assessment   Pain Assessment 0-10   Pain Score 3    Pain Location back   Pain Descriptors / Indicators Sore   Pain Intervention(s) Monitored during session   Cognitive-Linquistic Treatment   Treatment focused on Aphasia;Dysarthria   Skilled Treatment Pt reports her tongue biting is "much better." SLP suggested to pt that the faster family speaks the more determined she is at affecting THEIR rate of speech and not vice versa. SLP also educated pt re: need for incr'd effort with speech would be likely, eventually, and that putting more effort into her speech with abdominal musculature. Pt read 1/2 of her HEP with rare SBA. SLP told pt to cont with practice. In semi-structured tasks (multisentence) pt req'd min A rarely for overarticulation with informal cues for good breath support/strong abdominal support for speech.   Assessment / Recommendations /  Plan   Plan Continue with current plan of care   Progression Toward Goals   Progression toward goals Progressing toward goals          SLP Education - 06/16/15 1532    Education provided Yes   Education Details abdominal support for louder speech   Person(s) Educated Patient   Methods Explanation   Comprehension Verbalized understanding          SLP Short Term Goals - 06/16/15 1533    SLP SHORT TERM GOAL #1   Title pt will complete HEP with rare min A   Status Achieved   SLP SHORT TERM GOAL #2   Title pt will report 25% (subjective) improvement with articulation/speech production in late afternoon/early evenings   Time 2   Period Weeks   Status Partially Met   SLP SHORT TERM GOAL #3   Title pt will demo compensations for dysarthria in 10 minutes simple conversation with rare min A   Status Achieved   SLP SHORT TERM GOAL #4   Title pt will demo compensations for aphasia/verbal expression in 10 minutes simple conversation PRN   Status Partially Met   SLP SHORT TERM GOAL #5   Title pt will complete mod complex naming tasks with 85% success   Status Partially Met          SLP Long Term Goals - 06/16/15 1534    SLP LONG TERM GOAL #1   Title pt  will complete HEP indpeendently   Time 4   Period Weeks   Status On-going   SLP LONG TERM GOAL #2   Title pt to demo compensations for verbal expression in 10 minutes mod complex conversation/work-like verbal expression independently   Time 4   Period Weeks   Status On-going   SLP LONG TERM GOAL #3   Title pt will demo compensations for dysarthria in 10 minutes mod complex convesation/work like verbal expression independently   Time 4   Period Weeks   Status On-going   SLP LONG TERM GOAL #4   Title pt will report (subjectively) an improvement of at least 50% with articulation/speech production later in the day   Time 4   Period Weeks   Status On-going          Plan - 06/16/15 1532    Clinical Impression Statement  Min cues to use compensations for dysarthria this session. Continue skilled ST to maximize intelligibility and high level verbal expression.   Speech Therapy Frequency 2x / week   Duration --  6 weeks   Treatment/Interventions SLP instruction and feedback;Compensatory strategies;Patient/family education;Functional tasks;Cueing hierarchy   Potential to Achieve Goals Good   Consulted and Agree with Plan of Care Patient      Patient will benefit from skilled therapeutic intervention in order to improve the following deficits and impairments:   Dysarthria and anarthria  Aphasia    Problem List Patient Active Problem List   Diagnosis Date Noted  . Tremor 06/17/2013    [facility] ,[place], CCC-SLP  06/16/2015, 3:35 PM  [facility] [address] [place], [place], [address] Phone: [phone]   Fax:  [phone]   Name: SHERICA PATERNOSTRO MRN: [id number] Date of Birth: 02-24-1955

## 2015-06-17 NOTE — Therapy (Signed)
[facility] [address] [place], [place], [address] Phone: [phone]   Fax:  [phone]  Physical Therapy Treatment  Patient Details  Name: Laura Lawrence MRN: [id number] Date of Birth: 1955-07-20 Referring Provider: Marjory Lies  Encounter Date: 06/16/2015      PT End of Session - 06/17/15 2131    Visit Number 8   Number of Visits 17   Date for PT Re-Evaluation 06/28/15   Authorization Type BCBS-30 visit limit combined   Authorization - Visit Number 8   Authorization - Number of Visits 10  10 for PT (combined with OT and speech)   PT Start Time 1320   PT Stop Time 1400   PT Time Calculation (min) 40 min   Equipment Utilized During Treatment Gait belt   Activity Tolerance Patient tolerated treatment well   Behavior During Therapy [facility] for tasks assessed/performed      Past Medical History  Diagnosis Date  . Rheumatoid arthritis (HCC)   . Hypercholesterolemia   . Hypertension   . Multiple allergies   . Parkinson's disease (HCC)     No past surgical history on file.  There were no vitals filed for this visit.      Subjective Assessment - 06/17/15 2144    Subjective Mother-in-law passed away last week, so I've been otherwise occupied.  Had a near fall the other day (turning while I was holding something not using walker), but caught myself before falling.   Patient Stated Goals Pt's goal for therapy is to improve transitional movements-floor, transfers, bed mobility.       Gait training: -Gait x 400 ft using rollator walker with environmental scanning tasks, supervision, no loss of balance. -Pt reports difficulty with turns, especially in narrow spaces and when not using walker (due to tight spaces in home) -Gait training incorporating various turning methods:  Figure-8 turns using rollator walker, U-turns using rollator walker with supervision and verbal cues.  Practiced marching turns using rollator  walker with min guard assistance and verbal cues, for tighter space turns.  Practiced quarter turns at counter with counter and chair as UE support, 2 times clockwise and 2 times counter clockwise.  Then transitioned to modified quarter turns using walker in tight spaces with min guard assistance and verbal cues. -Progression of turning practice to incorporating short distance walking with quarter turns, carrying item to place on table/counter, no device, simulating short distance walking and carrying task at home, 8 reps with min guard/supervision -Progression of turning practice with rollator, with gait activities incorporating picking up object using side step and weightshift technique, placing cone on walker seat, then turning and walking to get additional cones.  (See HEP additions for more details)  Turning in Place: Solid Surface    Standing in place at the counter, lead with head and turn slowly making quarter turns toward left. Repeat _3___ times clockwise, then 3 times counterclockwise per session. Do _2-3___ sessions per day. (you will also want to do this type of turn when you have been standing/stopped for a while, and need to start walking by changing directions or turning.)  -If you do this with your walker, make sure to keep your feet near the wheels, so you are stepping within the base of support of the walker.  Copyright  VHI. All rights reserved.  Marching in Place: Varied Surfaces    March in place, slowly lifting knees toward ceiling. Use marching turns with your walker, like stomping, to turn  in tight spaces.  Practice 3 times clockwise, 3 times counter clockwise 1-2 times per day.  Copyright  VHI. All rights reserved.  Single Step: Side    Lifting foot off floor, take one step slowly to left side. Return to starting position.  You will want to use this method when you are using your walker and you want to pick up something and place it on the seat of the walker for  improved carrying.  Lock the brakes, with the walker slightly in front of you and take a side step as you reach out for the object.  Then step back towards the walker to prepare to start walking again. Copyright  VHI. All rights reserved.                             PT Education - 06/17/15 2130    Education provided Yes   Education Details HEP additions-turning strategies   Person(s) Educated Patient   Methods Explanation;Demonstration;Handout   Comprehension Verbalized understanding;Returned demonstration;Verbal cues required          PT Short Term Goals - 06/04/15 1506    PT SHORT TERM GOAL #1   Title Pt will be independent with HEP for improved balance, transfers, posture, gait.  TARGET 05/29/15   Baseline continues to need cues for intensity 06/04/15   Time 4   Period Weeks   Status Partially Met   PT SHORT TERM GOAL #2   Title Pt will be able to perform 6 of 10 reps of sit<>stand transfers with minimal UE support, for improved safety and efficiency with transfers.   Time 4   Period Weeks   Status Achieved   PT SHORT TERM GOAL #3   Title Pt will improve TUG socre to less than or equal to 13.5 seconds for decreased fall risk.   Baseline 8.5 on 06/04/15   Time 4   Period Weeks   Status Achieved   PT SHORT TERM GOAL #4   Title Pt will verbalize/demonstrate tips to reduce freezing episodes/falls with gait and turns.   Time 4   Period Weeks   Status Achieved   PT SHORT TERM GOAL #5   Title Pt will ambulate at least 500 ft using least restrictive assistive device, with supervision for improved safety and efficiency with gait.   Time 4   Period Weeks   Status Achieved           PT Long Term Goals - 04/30/15 2210    PT LONG TERM GOAL #1   Title Pt will report at least 25% improvement in bed mobility.  TARGET 06/28/15   Time 8   Period Weeks   Status New   PT LONG TERM GOAL #2   Title Pt will improve TUG manual score to less than or equal to 15  seconds for decreased fall risk.   Time 8   Period Weeks   Status New   PT LONG TERM GOAL #3   Title Pt will improve gait velocity to at least 2.62 ft/sec with appropriate assistive device for improved gait efficiency and safety.   Time 8   Period Weeks   Status New   PT LONG TERM GOAL #4   Title Pt will perform floor>stand transfer with UE support with minimal assistance for improved fall recovery.   Time 8   Period Weeks   Status New  Plan - 06/17/15 2139    Clinical Impression Statement Discussed limited visits with patient and she is in agreement with discharge after 2 additional visits.  Focused treatment session today on turning strategies per patient request.  Pt requires cues initially for strategies, then patient is able demonstrate understanding of strategies.  Pt will continue to benefit from further skilled PT to address balance, gait.   Rehab Potential Good   PT Frequency 2x / week   PT Duration 8 weeks  plus eval; may change due to visit limitations   PT Treatment/Interventions ADLs/Self Care Home Management;Therapeutic exercise;Therapeutic activities;Functional mobility training;Gait training;DME Instruction;Balance training;Neuromuscular re-education;Patient/family education   PT Next Visit Plan Review turning strategies from HEP; gait with dual tasking; begin checking goals    Consulted and Agree with Plan of Care Patient      Patient will benefit from skilled therapeutic intervention in order to improve the following deficits and impairments:  Abnormal gait, Decreased balance, Decreased mobility, Decreased knowledge of use of DME, Decreased strength, Difficulty walking, Postural dysfunction  Visit Diagnosis: Other abnormalities of gait and mobility     Problem List Patient Active Problem List   Diagnosis Date Noted  . Tremor 06/17/2013    Dystany Duffy W. 06/17/2015, 9:47 PM Frazier Butt., PT [place] [address] [place], [place], [address] Phone: [phone]   Fax:  [phone]  Name: MARIALUISA BASARA MRN: [id number] Date of Birth: 1955-03-18

## 2015-06-18 ENCOUNTER — Ambulatory Visit: Payer: BLUE CROSS/BLUE SHIELD | Admitting: Physical Therapy

## 2015-06-18 ENCOUNTER — Encounter: Payer: BLUE CROSS/BLUE SHIELD | Admitting: Occupational Therapy

## 2015-06-23 ENCOUNTER — Encounter: Payer: BLUE CROSS/BLUE SHIELD | Admitting: Occupational Therapy

## 2015-06-23 ENCOUNTER — Ambulatory Visit: Payer: BLUE CROSS/BLUE SHIELD | Admitting: Physical Therapy

## 2015-06-25 ENCOUNTER — Ambulatory Visit: Payer: BLUE CROSS/BLUE SHIELD

## 2015-06-25 ENCOUNTER — Ambulatory Visit: Payer: BLUE CROSS/BLUE SHIELD | Admitting: Occupational Therapy

## 2015-06-25 ENCOUNTER — Ambulatory Visit: Payer: BLUE CROSS/BLUE SHIELD | Admitting: Physical Therapy

## 2015-06-25 DIAGNOSIS — R471 Dysarthria and anarthria: Secondary | ICD-10-CM

## 2015-06-25 DIAGNOSIS — R4701 Aphasia: Secondary | ICD-10-CM

## 2015-06-25 DIAGNOSIS — R29818 Other symptoms and signs involving the nervous system: Secondary | ICD-10-CM

## 2015-06-25 DIAGNOSIS — R278 Other lack of coordination: Secondary | ICD-10-CM

## 2015-06-25 DIAGNOSIS — R2689 Other abnormalities of gait and mobility: Secondary | ICD-10-CM

## 2015-06-25 DIAGNOSIS — R29898 Other symptoms and signs involving the musculoskeletal system: Secondary | ICD-10-CM

## 2015-06-25 NOTE — Therapy (Signed)
[facility] [address] [place], [place], [address] Phone: [phone]   Fax:  [phone]  Speech Language Pathology Treatment  Patient Details  Name: Laura Lawrence MRN: [id number] Date of Birth: 1955-07-03 Referring Provider: Dr. Leta Baptist  Encounter Date: 06/25/2015      End of Session - 06/25/15 1651    Visit Number 7   Number of Visits 16   Date for SLP Re-Evaluation 07/06/15   SLP Start Time 1448   SLP Stop Time  1530   SLP Time Calculation (min) 42 min   Activity Tolerance Patient tolerated treatment well      Past Medical History  Diagnosis Date  . Rheumatoid arthritis ([place])   . Hypercholesterolemia   . Hypertension   . Multiple allergies   . Parkinson's disease ([place])     No past surgical history on file.  There were no vitals filed for this visit.      Subjective Assessment - 06/25/15 1534    Subjective Pt reports she has less difficulty communicating with her husband.               ADULT SLP TREATMENT - 06/25/15 1448    General Information   Behavior/Cognition Alert;Cooperative;Pleasant mood   Treatment Provided   Treatment provided Cognitive-Linquistic   Cognitive-Linquistic Treatment   Treatment focused on Aphasia;Dysarthria   Skilled Treatment Pt would like to have next week's 2nd session as last ST. Pt reports communication with husband has significantly improved.  In semi-structured tasks, pt maintained overarticulation 85% of the time, and abdominal push adequate for intelligible speech 90% of the time. Pt reported self-correcting softer speech and less-articulated speech to louder (more effort) and clearer (better articulated) speech. Outdoors the patinet maintained intelligible speech 100% of the time.    Assessment / Recommendations / Plan   Plan Continue with current plan of care   Progression Toward Goals   Progression toward goals Progressing toward goals            SLP Short Term Goals - 06/25/15 1446    SLP SHORT TERM GOAL #1   Title pt will complete HEP with rare min A   Status Achieved   SLP SHORT TERM GOAL #2   Title pt will report 25% (subjective) improvement with articulation/speech production in late afternoon/early evenings   Time 1   Period Weeks   Status Partially Met   SLP SHORT TERM GOAL #3   Title pt will demo compensations for dysarthria in 10 minutes simple conversation with rare min A   Status Achieved   SLP SHORT TERM GOAL #4   Title pt will demo compensations for aphasia/verbal expression in 10 minutes simple conversation PRN   Status Partially Met   SLP SHORT TERM GOAL #5   Title pt will complete mod complex naming tasks with 85% success   Status Partially Met          SLP Long Term Goals - 06/25/15 1446    SLP LONG TERM GOAL #1   Title pt will complete HEP indpeendently   Time 3   Period Weeks   Status On-going   SLP LONG TERM GOAL #2   Title pt to demo compensations for verbal expression in 10 minutes mod complex conversation/work-like verbal expression independently   Time 3   Period Weeks   Status On-going   SLP LONG TERM GOAL #3   Title pt will demo compensations for dysarthria in 10 minutes mod complex convesation/work  like verbal expression independently   Time 3   Period Weeks   Status On-going   SLP LONG TERM GOAL #4   Title pt will report (subjectively) an improvement of at least 50% with articulation/speech production later in the day   Time 3   Period Weeks   Status On-going          Plan - 06/25/15 1651    Clinical Impression Statement Min cues to use compensations for dysarthria this session. Pt appears satisfied with progress to this point and is fine with discharge next session. SLP agrees.   Speech Therapy Frequency 2x / week   Duration --  5 weeks   Treatment/Interventions SLP instruction and feedback;Compensatory strategies;Patient/family education;Functional tasks;Cueing hierarchy    Potential to Achieve Goals Good   Consulted and Agree with Plan of Care Patient      Patient will benefit from skilled therapeutic intervention in order to improve the following deficits and impairments:   Dysarthria and anarthria  Aphasia    Problem List Patient Active Problem List   Diagnosis Date Noted  . Tremor 06/17/2013    [facility] ,[place], CCC-SLP  06/25/2015, 4:54 PM  [facility] [address] [place], [place], [address] Phone: [phone]   Fax:  [phone]   Name: Laura Lawrence MRN: [id number] Date of Birth: [birth year]

## 2015-06-25 NOTE — Patient Instructions (Signed)
Memory Compensation Strategies  1. Use "WARM" strategy.  W= write it down  A= associate it  R= repeat it  M= make a mental note  2.   You can keep a Glass blower/designer.  Use a 3-ring notebook with sections for the following: calendar, important names and phone numbers,  medications, doctors' names/phone numbers, lists/reminders, and a section to journal what you did  each day.   3.    Use a calendar to write appointments down.  4.    Write yourself a schedule for the day.  This can be placed on the calendar or in a separate section of the Memory Notebook.  Keeping a  regular schedule can help memory.  5.    Use medication organizer with sections for each day or morning/evening pills.  You may need help loading it  6.    Keep a basket, or pegboard by the door.  Place items that you need to take out with you in the basket or on the pegboard.  You may also want to  include a message board for reminders.  7.    Use sticky notes.  Place sticky notes with reminders in a place where the task is performed.  For example: " turn off the  stove" placed by the stove, "lock the door" placed on the door at eye level, " take your medications" on  the bathroom mirror or by the place where you normally take your medications.  8.    Use alarms/timers.  Use while cooking to remind yourself to check on food or as a reminder to take your medicine, or as a  reminder to make a call, or as a reminder to perform another task, etc.    Keeping Thinking Skills Sharp: 1. Jigsaw puzzles 2. Card/board games 3. Talking on the phone/social events 4. [email or web address] 5. Online games 6. Word serches/crossword puzzles 7.  Logic puzzles 8. Aerobic exercise (stationary bike) 9. Eating balanced diet (fruits & veggies) 10. Drink water 11. Try something new--new recipe, hobby 12. Crafts 13. Do a variety of activities that are challenging 14. Add cognitive activities to walking/exercising (think of animal/food/city with  each letter of the alphabet, counting backwards, thinking of as many vegetables as you can, etc.).--Only do this  If safe (no freezing/falls).Keeping Thinking Skills Sharp: 1. Jigsaw puzzles 2. Card/board games 3. Talking on the phone/social events 4. [email or web address] 5. Online games 6. Word serches/crossword puzzles 7.  Logic puzzles 8. Aerobic exercise (stationary bike) 9. Eating balanced diet (fruits & veggies) 10. Drink water 11. Try something new--new recipe, hobby 12. Crafts 13. Do a variety of activities that are challenging 14. Add cognitive activities to walking/exercising (think of animal/food/city with each letter of the alphabet, counting backwards, thinking of as many vegetables as you can, etc.).--Only do this  If safe (no freezing/falls).Keeping Thinking Skills Sharp: 1. Jigsaw puzzles 2. Card/board games 3. Talking on the phone/social events 4. [email or web address] 5. Online games 6. Word serches/crossword puzzles 7.  Logic puzzles 8. Aerobic exercise (stationary bike) 9. Eating balanced diet (fruits & veggies) 10. Drink water 11. Try something new--new recipe, hobby 12. Crafts 13. Do a variety of activities that are challenging 14. Add cognitive activities to walking/exercising (think of animal/food/city with each letter of the alphabet, counting backwards, thinking of as many vegetables as you can, etc.).--Only do this  If safe (no freezing/falls).Keeping Thinking Skills Sharp: 1. Jigsaw puzzles 2. Card/board games 3. Talking on the phone/social events 4. [email or web address] 5. Online games  6. Word serches/crossword puzzles 7.  Logic puzzles 8. Aerobic exercise (stationary bike) 9. Eating balanced diet (fruits & veggies) 10. Drink water 11. Try something new--new recipe, hobby 12. Crafts 13. Do a variety of activities that are challenging 14. Add cognitive activities to walking/exercising (think of animal/food/city with each letter of the alphabet, counting backwards,  thinking of as many vegetables as you can, etc.).--Only do this  If safe (no freezing/falls).Keeping Thinking Skills Sharp: 1. Jigsaw puzzles 2. Card/board games 3. Talking on the phone/social events 4. [email or web address] 5. Online games 6. Word serches/crossword puzzles 7.  Logic puzzles 8. Aerobic exercise (stationary bike) 9. Eating balanced diet (fruits & veggies) 10. Drink water 11. Try something new--new recipe, hobby 12. Crafts 13. Do a variety of activities that are challenging 14. Add cognitive activities to walking/exercising (think of animal/food/city with each letter of the alphabet, counting backwards, thinking of as many vegetables as you can, etc.).--Only do this  If safe (no freezing/falls).Keeping Thinking Skills Sharp: 1. Jigsaw puzzles 2. Card/board games 3. Talking on the phone/social events 4. [email or web address] 5. Online games 6. Word serches/crossword puzzles 7.  Logic puzzles 8. Aerobic exercise (stationary bike) 9. Eating balanced diet (fruits & veggies) 10. Drink water 11. Try something new--new recipe, hobby 12. Crafts 13. Do a variety of activities that are challenging 14. Add cognitive activities to walking/exercising (think of animal/food/city with each letter of the alphabet, counting backwards, thinking of as many vegetables as you can, etc.).--Only do this  If safe (no freezing/falls).

## 2015-06-25 NOTE — Therapy (Signed)
[facility] [address] [place], [place], [address] Phone: [phone]   Fax:  [phone]  Occupational Therapy Treatment  Patient Details  Name: Laura Lawrence MRN: [id number] Date of Birth: 29-Sep-1955 Referring Provider: Dr. Marjory Lawrence  Encounter Date: 06/25/2015      OT End of Session - 06/25/15 1433    Visit Number 8   Number of Visits 17   Date for OT Re-Evaluation 06/29/15   Authorization Type BCBS   Authorization - Visit Number 8   Authorization - Number of Visits 10   OT Start Time 1405   OT Stop Time 1445   OT Time Calculation (min) 40 min   Activity Tolerance Patient tolerated treatment well   Behavior During Therapy [facility] for tasks assessed/performed      Past Medical History  Diagnosis Date  . Rheumatoid arthritis (HCC)   . Hypercholesterolemia   . Hypertension   . Multiple allergies   . Parkinson's disease (HCC)     No past surgical history on file.  There were no vitals filed for this visit.      Subjective Assessment - 06/25/15 1429    Subjective  Pt [place]   Pertinent History see Epic    Patient Stated Goals increased ease with ADLs   Currently in Pain? Yes   Pain Score 3    Pain Location Elbow   Pain Descriptors / Indicators Sore   Pain Type Acute pain   Pain Onset 1 to 4 weeks ago   Pain Frequency Intermittent   Aggravating Factors  movement   Pain Relieving Factors not moving            Therapist checked progress towards short term goals- see goals for progress . Education provided regarding keeping thinking skills sharp/ memory compensation  Strategies, and ways to prevent future PD-related complications/ community resources. Pt verbalized understanding. Placing grooved pegs in pegboard with bilateral UE's then removing using PWR! Step, min difficulty/ v.c for techniques Dynamic step and reach with trunk rotation with bilateral  UE's and emphasis on elbow extension, min v.c. For large amplitude movements.                    OT Short Term Goals - 06/25/15 1406    OT SHORT TERM GOAL #1   Title Pt will be independent with HEP    Time 4   Period Weeks   Status Achieved   OT SHORT TERM GOAL #2   Title Pt will reduce simluated dressing task on Physical Performance test #4 by 5 seconds    Baseline 40.67   Time 4   Period Weeks   Status Achieved  19.15 secs   OT SHORT TERM GOAL #3   Title Pt will verbalize understanding of adapted strategies for ADLS/IADLS.   Time 4   Period Weeks   Status Achieved   OT SHORT TERM GOAL #4   Title Pt will verbalize understanding of compensatory strategies for short term memory deficits and ways to keep thinking skills sharp.   Time 4   Period Weeks   Status Achieved   OT SHORT TERM GOAL #5   Title Pt will verbalize understanding of community resources and ways to prevent future PD related complications.   Time 4   Period Weeks   Status Achieved           OT Long Term Goals - 06/25/15 1426  OT LONG TERM GOAL #1   Title Pt will decrease time on 3 button/ unbutton task to 26 sec or less   Baseline 31.09   Time 8   Period Weeks   Status On-going   OT LONG TERM GOAL #2   Title Pt will decrease 9 hole peg test by 3 seconds for LUE   Baseline 37.10   Time 8   Period Weeks   Status On-going   OT LONG TERM GOAL #3   Title Pt will decrease simulated dressing task on Physical Peformance test by 10 seconds    Baseline 40.67   Time 8   Period Weeks   Status Achieved   OT LONG TERM GOAL #4   Title Pt will decrease simluated eating task on Physical Performance Test by 3 seconds    Baseline 13.90   Time 8   Period Weeks   Status On-going   OT LONG TERM GOAL #5   Title Pt will retrieve a lightweight object from overhead shelf with -15 elbow extension and pain no greater then 3/10 (for bilaterally UE's tested individually)   Baseline RUE elbow -20, LUE  elbow -25   Time 8   Period Weeks   Status On-going   OT LONG TERM GOAL #6   Title --------------------------------------------------------------------------------------------------------------------------   Status New               Plan - 06/25/15 1623    Clinical Impression Statement Pt made excellent overall progress towards short term goals. Pt requests d/c next visit [place]   Rehab Potential Good   OT Frequency 2x / week   OT Duration 8 weeks   OT Treatment/Interventions Self-care/ADL training;Therapeutic exercise;Functional Mobility Training;Patient/family education;Balance training;Splinting;Manual Therapy;Ultrasound;Neuromuscular education;Energy conservation;Therapeutic exercises;Therapeutic activities;DME and/or AE instruction;Parrafin;Cryotherapy;Electrical Stimulation;Fluidtherapy;Gait Training;Scar mobilization;Cognitive remediation/compensation;Passive range of motion;Visual/perceptual remediation/compensation;Contrast Bath;Moist Heat   Plan anticipate d/c next visit   OT Home Exercise Plan Education Provided:  issued PWR! seated; coordination HEP, PWR! hands (basic 4)   Consulted and Agree with Plan of Care Patient      Patient will benefit from skilled therapeutic intervention in order to improve the following deficits and impairments:  Abnormal gait, Decreased coordination, Decreased range of motion, Difficulty walking, Impaired flexibility, Increased edema, Decreased safety awareness, Decreased endurance, Decreased activity tolerance, Pain, Impaired UE functional use, Decreased knowledge of use of DME, Decreased balance, Decreased cognition, Decreased mobility, Decreased strength  Visit Diagnosis: Other symptoms and signs involving the nervous system  Other symptoms and signs involving the musculoskeletal system  Other lack of coordination    Problem List Patient Active Problem List   Diagnosis Date Noted  .  Tremor 06/17/2013    Laura Lawrence 06/25/2015, 4:24 PM Keene Breath, OTR/L [phone] Phone: [phone] 4:24 PM 06/25/2015 [facility] [address] [place], [place], [address] Phone: [phone]   Fax:  [phone]  Name: Laura Lawrence MRN: [id number] Date of Birth: [birth year]

## 2015-06-26 NOTE — Therapy (Signed)
[facility] [address] [place], [place], [address] Phone: [phone]   Fax:  [phone]  Physical Therapy Treatment  Patient Details  Name: Laura Lawrence MRN: [id number] Date of Birth: Feb 03, 1956 Referring Provider: Leta Baptist  Encounter Date: 06/25/2015      PT End of Session - 06/26/15 1154    Visit Number 9   Number of Visits 17   Date for PT Re-Evaluation 06/28/15   Authorization Type BCBS-30 visit limit combined   Authorization - Visit Number 9   Authorization - Number of Visits 10  10 for PT (combined with OT and speech)   PT Start Time 1319   PT Stop Time 1400   PT Time Calculation (min) 41 min   Equipment Utilized During Treatment Gait belt   Activity Tolerance Patient tolerated treatment well   Behavior During Therapy [facility] for tasks assessed/performed      Past Medical History  Diagnosis Date  . Rheumatoid arthritis ([place])   . Hypercholesterolemia   . Hypertension   . Multiple allergies   . Parkinson's disease ([place])     No past surgical history on file.  There were no vitals filed for this visit.      Subjective Assessment - 06/25/15 1323    Subjective Had one fall, where I got stuck/frozen and felt too week to keep going; happened in the hallway near a closet.  Had to have husband's help to get back up.  No injuries.   Pertinent History History of Rheumatoid Arthritis   Patient Stated Goals Pt's goal for therapy is to improve transitional movements-floor, transfers, bed mobility.   Currently in Pain? No/denies        Gait:  Pt ambulates multiple laps around gym area, using rollator walker, with supervision/min guard assistance, including turns and changes of direction, at least 500 ft, incorporating strategies for maneuvering tight spaces and turns provided as HEP last visit (quarter turn/clock turn without walker at counter x 3 reps each direction, then with walker in tight spaces,  marching turn).  Pt has no LOB and PT provides only occasional cues for pacing with gait.  -Discussed/practiced initiation of gait and way to counteract freezing episode, paying attention to stance position-practice stagger stance with widened BOS with anterior/posterior weightshifting and widened BOS standing with lateral weightshifting.  -Gait using rollator walker x 600 ft with supervision/min guard assistance with cognitive tasks of naming cities [place] (pt takes >5 minutes to walk and name A-X).  No LOB noted with gait, but PT did have discussion with patient about multi-tasking with gait:  Discussed delegating manual/physical tasks to husband, using seat of rollator to carry things or make extra trips with rollator to carry items.  Discussed trying to avoid additional cognitive/conversation tasks during gait in order to focus only on gait, for improved gait safety and efficiency.   Therapeutic Activity: -Floor to stand transfer x 2 reps at mat surface, with pt requiring min assistance for sidesit up to quadruped position, then tall kneel to stand.  Discussed transfer technique, including pacing/taking time and checking for injuries after a fall, as well as how husband can safely and appropriately provide assistance to patient with floor>stand transfer.  Pt verbalizes and demo understanding.                         PT Education - 06/26/15 1153    Education provided Yes   Education Details floor to stand transfers  Person(s) Educated Patient   Methods Explanation;Demonstration   Comprehension Verbalized understanding;Returned demonstration          PT Short Term Goals - 06/04/15 1506    PT SHORT TERM GOAL #1   Title Pt will be independent with HEP for improved balance, transfers, posture, gait.  TARGET 05/29/15   Baseline continues to need cues for intensity 06/04/15   Time 4   Period Weeks   Status Partially Met   PT SHORT TERM GOAL #2   Title Pt will be able to  perform 6 of 10 reps of sit<>stand transfers with minimal UE support, for improved safety and efficiency with transfers.   Time 4   Period Weeks   Status Achieved   PT SHORT TERM GOAL #3   Title Pt will improve TUG socre to less than or equal to 13.5 seconds for decreased fall risk.   Baseline 8.5 on 06/04/15   Time 4   Period Weeks   Status Achieved   PT SHORT TERM GOAL #4   Title Pt will verbalize/demonstrate tips to reduce freezing episodes/falls with gait and turns.   Time 4   Period Weeks   Status Achieved   PT SHORT TERM GOAL #5   Title Pt will ambulate at least 500 ft using least restrictive assistive device, with supervision for improved safety and efficiency with gait.   Time 4   Period Weeks   Status Achieved           PT Long Term Goals - 06/25/15 1325    PT LONG TERM GOAL #1   Title Pt will report at least 25% improvement in bed mobility.  TARGET 06/28/15   Baseline Pt reports at least 50% improvement in bed mobility.   Status Achieved   PT LONG TERM GOAL #2   Title Pt will improve TUG manual score to less than or equal to 15 seconds for decreased fall risk.   Time 8   Period Weeks   Status On-going   PT LONG TERM GOAL #3   Title Pt will improve gait velocity to at least 2.62 ft/sec with appropriate assistive device for improved gait efficiency and safety.   Time 8   Period Weeks   Status On-going   PT LONG TERM GOAL #4   Title Pt will perform floor>stand transfer with UE support with minimal assistance for improved fall recovery.   Status Achieved               Plan - 06/26/15 1154    Clinical Impression Statement Pt has met LTG #1 and #4 and appears on target for meeting remaining goals.  Overall, pt seems to be incorporating strategies and techniques from therapy into daily activities and will be appropriate for discharge after next visit.   Rehab Potential Good   PT Frequency 2x / week   PT Duration 8 weeks  plus eval; may change due to visit  limitations   PT Treatment/Interventions ADLs/Self Care Home Management;Therapeutic exercise;Therapeutic activities;Functional mobility training;Gait training;DME Instruction;Balance training;Neuromuscular re-education;Patient/family education   PT Next Visit Plan Check remaining goals, review HEP and turning strategies; follow up about PWR! ex class and plan for discharge next visit   Consulted and Agree with Plan of Care Patient      Patient will benefit from skilled therapeutic intervention in order to improve the following deficits and impairments:  Abnormal gait, Decreased balance, Decreased mobility, Decreased knowledge of use of DME, Decreased strength, Difficulty walking, Postural dysfunction  Visit Diagnosis: Other abnormalities of gait and mobility     Problem List Patient Active Problem List   Diagnosis Date Noted  . Tremor 06/17/2013    MARRIOTT,AMY W. 06/26/2015, 11:57 AM  Frazier Butt., PT [facility] [address] [place], [place], [address] Phone: [phone]   Fax:  [phone]  Name: NAFEESAH LAPAGLIA MRN: [id number] Date of Birth: 1956-01-10

## 2015-06-30 ENCOUNTER — Encounter: Payer: BLUE CROSS/BLUE SHIELD | Admitting: Occupational Therapy

## 2015-06-30 ENCOUNTER — Ambulatory Visit: Payer: BLUE CROSS/BLUE SHIELD | Admitting: Physical Therapy

## 2015-07-02 ENCOUNTER — Ambulatory Visit: Payer: BLUE CROSS/BLUE SHIELD

## 2015-07-02 ENCOUNTER — Ambulatory Visit: Payer: BLUE CROSS/BLUE SHIELD | Admitting: Physical Therapy

## 2015-07-02 ENCOUNTER — Ambulatory Visit: Payer: BLUE CROSS/BLUE SHIELD | Admitting: Occupational Therapy

## 2015-07-02 DIAGNOSIS — R29818 Other symptoms and signs involving the nervous system: Secondary | ICD-10-CM

## 2015-07-02 DIAGNOSIS — R29898 Other symptoms and signs involving the musculoskeletal system: Secondary | ICD-10-CM

## 2015-07-02 DIAGNOSIS — R2689 Other abnormalities of gait and mobility: Secondary | ICD-10-CM | POA: Diagnosis not present

## 2015-07-02 DIAGNOSIS — R4701 Aphasia: Secondary | ICD-10-CM

## 2015-07-02 DIAGNOSIS — R278 Other lack of coordination: Secondary | ICD-10-CM

## 2015-07-02 DIAGNOSIS — R471 Dysarthria and anarthria: Secondary | ICD-10-CM

## 2015-07-02 NOTE — Therapy (Signed)
[facility] [address] [place], [place], [address] Phone: [phone]   Fax:  [phone]  Occupational Therapy Treatment  Patient Details  Name: Laura Lawrence MRN: [id number] Date of Birth: 11-Jan-1956 Referring Provider: Dr. Leta Baptist  Encounter Date: 07/02/2015      OT End of Session - 07/02/15 1240    Visit Number 9   Number of Visits 17   Date for OT Re-Evaluation 06/29/15   Authorization Type BCBS   Authorization - Visit Number 9   Authorization - Number of Visits 10   OT Start Time 1021   OT Stop Time 1100   OT Time Calculation (min) 39 min   Activity Tolerance Patient tolerated treatment well   Behavior During Therapy [facility] for tasks assessed/performed      Past Medical History  Diagnosis Date  . Rheumatoid arthritis ([place])   . Hypercholesterolemia   . Hypertension   . Multiple allergies   . Parkinson's disease ([place])     No past surgical history on file.  There were no vitals filed for this visit.      Subjective Assessment - 07/02/15 1021    Subjective  Pt reports that she had a fall last week (walking without walker)--just bruised   Pertinent History see Epic    Patient Stated Goals increased ease with ADLs   Currently in Pain? No/denies       Sliding cards across tabletop using PWR! Hands with min cueing and mod difficulty with each hand.    Checked remaining goals and discussed progress.--see below.  Recommended pt establish care with movement disorder specialist, and PD trained therapists, and PD specific community fitness opportunities once she moves.  Pt verbalized understanding.  In sitting, functional reaching overhead with each UE to place small pegs in vertical pegboard to copy design for incr coordination and cognitive component.  Pt able to copy with min difficulty with coordination and 100% accuracy copying for                          OT Short Term Goals  - 06/25/15 1406    OT SHORT TERM GOAL #1   Title Pt will be independent with HEP    Time 4   Period Weeks   Status Achieved   OT SHORT TERM GOAL #2   Title Pt will reduce simluated dressing task on Physical Performance test #4 by 5 seconds    Baseline 40.67   Time 4   Period Weeks   Status Achieved  19.15 secs   OT SHORT TERM GOAL #3   Title Pt will verbalize understanding of adapted strategies for ADLS/IADLS.   Time 4   Period Weeks   Status Achieved   OT SHORT TERM GOAL #4   Title Pt will verbalize understanding of compensatory strategies for short term memory deficits and ways to keep thinking skills sharp.   Time 4   Period Weeks   Status Achieved   OT SHORT TERM GOAL #5   Title Pt will verbalize understanding of community resources and ways to prevent future PD related complications.   Time 4   Period Weeks   Status Achieved           OT Long Term Goals - 07/02/15 1024    OT LONG TERM GOAL #1   Title Pt will decrease time on 3 button/ unbutton task to 26 sec or less   Baseline 31.09  Time 8   Period Weeks   Status Not Met  07/02/15:  44.63sec; 27.44sec with PWR! hands strategies   OT LONG TERM GOAL #2   Title Pt will decrease 9 hole peg test by 3 seconds for LUE   Baseline 37.10   Time 8   Period Weeks   Status Achieved  07/02/15:  35.53sec   OT LONG TERM GOAL #3   Title Pt will decrease simulated dressing task on Physical Peformance test by 10 seconds    Baseline 40.67   Time 8   Period Weeks   Status Achieved   OT LONG TERM GOAL #4   Title Pt will decrease simluated eating task on Physical Performance Test by 3 seconds    Baseline 13.90   Time 8   Period Weeks   Status On-going  07/02/15:  10.84sec   OT LONG TERM GOAL #5   Title Pt will retrieve a lightweight object from overhead shelf with -15 elbow extension and pain no greater then 3/10 (for bilaterally UE's tested individually)   Baseline RUE elbow -20, LUE elbow -25   Time 8   Period Weeks    Status Achieved  07/02/15:  LUE WNL, RUE WNL   OT LONG TERM GOAL #6   Title --------------------------------------------------------------------------------------------------------------------------   Status New        OCCUPATIONAL THERAPY DISCHARGE SUMMARY  Visits from Start of Care: see above  Current functional level related to goals / functional outcomes: See above   Remaining deficits: Bradykinesia, rigidity, decr coordination, decr functional mobility-- all improved   Education / Equipment: Pt instructed in PD-specific HEP, strategies for ADLs, memory compensation strategies/ways to promote cognition.  Pt verbalized understanding of all education provided, community resources/ways to prevent future complications.    Plan: Patient agrees to discharge.  Patient goals were partially met. Patient is being discharged due to being pleased with the current functional level. and insurance visit limits.  Pt would benefit from occupational therapy screen/re-evaluation in approx 6-17month.  However, pt will be moving out of the area soon.  Recommended pt establish care with neurologist and therapists that are PD-trained as able once she moves. ?????             Plan - 07/02/15 1241    Clinical Impression Statement Pt has made good progress towards goals with improved rigidity/ROM and coordination.     Plan d/c OT   OT Home Exercise Plan Education Provided:  issued PWR! seated; coordination HEP, PWR! hands (basic 4)   Consulted and Agree with Plan of Care Patient      Patient will benefit from skilled therapeutic intervention in order to improve the following deficits and impairments:     Visit Diagnosis: Other symptoms and signs involving the nervous system  Other symptoms and signs involving the musculoskeletal system  Other lack of coordination    Problem List Patient Active Problem List   Diagnosis Date Noted  . Tremor 06/17/2013    [facility],  12:48 PM  [address]ST[place] [address]Phone: [phone]  Fax:  [phone] Name: Laura MINIUMMRN: [id number] of Birth: [birth year]  AVianne Bulls OTR/L [facility] [place]  [phone] [phone]/18/2017 12:48 PM

## 2015-07-02 NOTE — Therapy (Signed)
[facility] [address] [place], [place], [address] Phone: [phone]   Fax:  [phone]  Physical Therapy Treatment  Patient Details  Name: Laura Lawrence MRN: [id number] Date of Birth: [birth year] Referring Provider: Leta Baptist  Encounter Date: 07/02/2015      PT End of Session - 07/02/15 1310    Visit Number 10   Number of Visits 17   Date for PT Re-Evaluation 06/28/15   Authorization Type BCBS-30 visit limit combined   Authorization - Visit Number 10   Authorization - Number of Visits 10  10 for PT (combined with OT and speech)   PT Start Time 0934   PT Stop Time 1012   PT Time Calculation (min) 38 min   Activity Tolerance Patient tolerated treatment well   Behavior During Therapy [facility] for tasks assessed/performed      Past Medical History  Diagnosis Date  . Rheumatoid arthritis ([place])   . Hypercholesterolemia   . Hypertension   . Multiple allergies   . Parkinson's disease ([place])     No past surgical history on file.  There were no vitals filed for this visit.      Subjective Assessment - 07/02/15 0936    Subjective Had one fall near the couch trying to get to my walker in the kitchen.  Have rearranged, so can use the walker in the living area.   Pertinent History History of Rheumatoid Arthritis   Patient Stated Goals Pt's goal for therapy is to improve transitional movements-floor, transfers, bed mobility.   Currently in Pain? No/denies                         [facility] Adult PT Treatment/Exercise - 07/02/15 0001    Ambulation/Gait   Ambulation/Gait Yes   Ambulation/Gait Assistance 5: Supervision   Ambulation Distance (Feet) 200 Feet  400 ft, then 250 with turns and starts/stops   Assistive device Rollator   Gait Pattern Step-through pattern;Poor foot clearance - left   Ambulation Surface Level;Indoor   Gait velocity 12.90 sec = 2.54 ft/sec   Gait Comments Gait activities in  gym area with starts/stops with changes of directions and turns using rollator, with pt utilizing strategies for improved turning.   Timed Up and Go Test   TUG Normal TUG   Normal TUG (seconds) 18.02  with rollator   Cognitive TUG (seconds) 20.5  with rollator          Self Care: Discussed in detail finding appropriate PD-related resources upon move to [facility].  Discussed optimal fitness options upon D/C from PT, including exercise chart for prioritizing exercises.  Reviewed HEP, with discussion on importance of daily performance on therapy-based exercises.  Discussed and provided information on PWR! Moves community fitness class, which meets weekly.  Pt verbalizes interest in participating.      PT Education - 07/02/15 1308    Education provided Yes   Education Details Verbal review of HEP (with pt's handouts); how to find PD resources after move to [facility]; optimal fitness options upon D/C from PT; PWR! Moves exercise class; progress towards goals and plans for discharge   Person(s) Educated Patient   Methods Explanation;Handout   Comprehension Verbalized understanding          PT Short Term Goals - 06/04/15 1506    PT SHORT TERM GOAL #1   Title Pt will be independent with HEP for improved balance, transfers, posture, gait.  TARGET 05/29/15  Baseline continues to need cues for intensity 06/04/15   Time 4   Period Weeks   Status Partially Met   PT SHORT TERM GOAL #2   Title Pt will be able to perform 6 of 10 reps of sit<>stand transfers with minimal UE support, for improved safety and efficiency with transfers.   Time 4   Period Weeks   Status Achieved   PT SHORT TERM GOAL #3   Title Pt will improve TUG socre to less than or equal to 13.5 seconds for decreased fall risk.   Baseline 8.5 on 06/04/15   Time 4   Period Weeks   Status Achieved   PT SHORT TERM GOAL #4   Title Pt will verbalize/demonstrate tips to reduce freezing episodes/falls with gait and turns.   Time 4   Period  Weeks   Status Achieved   PT SHORT TERM GOAL #5   Title Pt will ambulate at least 500 ft using least restrictive assistive device, with supervision for improved safety and efficiency with gait.   Time 4   Period Weeks   Status Achieved           PT Long Term Goals - 07/02/15 0951    PT LONG TERM GOAL #1   Title Pt will report at least 25% improvement in bed mobility.  TARGET 06/28/15   Baseline Pt reports at least 50% improvement in bed mobility.   Status Achieved   PT LONG TERM GOAL #2   Title Pt will improve TUG manual score to less than or equal to 15 seconds for decreased fall risk.   Baseline TUG score 18.02 sec; TUG cogn 20.50 sec with rollator; TUG manual NT due to pt using rollator for primary gait at this time.   Time 8   Period Weeks   Status Not Met   PT LONG TERM GOAL #3   Title Pt will improve gait velocity to at least 2.62 ft/sec with appropriate assistive device for improved gait efficiency and safety.   Time 8   Period Weeks   Status Not Met   PT LONG TERM GOAL #4   Title Pt will perform floor>stand transfer with UE support with minimal assistance for improved fall recovery.   Status Achieved               Plan - 07/02/15 1310    Clinical Impression Statement Pt has not met LTG #2 and 3.  She is primarily using rollator walker for improved safety with gait at this time, and therefore, measures of gait speed and TUG show slowing due to improved awareness to gait, improved pacing with gait.  Pt appears to be utilizing strategies and techniques from therapy into daily functional activities.  Pt appropriate for discharge at this time.   Rehab Potential Good   PT Frequency 2x / week   PT Duration 8 weeks  plus eval; may change due to visit limitations   PT Treatment/Interventions ADLs/Self Care Home Management;Therapeutic exercise;Therapeutic activities;Functional mobility training;Gait training;DME Instruction;Balance training;Neuromuscular  re-education;Patient/family education   PT Next Visit Plan Discharge this visit.   Consulted and Agree with Plan of Care Patient      Patient will benefit from skilled therapeutic intervention in order to improve the following deficits and impairments:  Abnormal gait, Decreased balance, Decreased mobility, Decreased knowledge of use of DME, Decreased strength, Difficulty walking, Postural dysfunction  Visit Diagnosis: Other abnormalities of gait and mobility     Problem List Patient Active Problem List  Diagnosis Date Noted  . Tremor 06/17/2013    Adoni Greenough W. 07/02/2015, 1:14 PM  Frazier Butt., PT [facility] [address] [place], [place], [address] Phone: [phone]   Fax:  [phone]  Name: Laura Lawrence MRN: [id number] Date of Birth: 12-30-1955    PHYSICAL THERAPY DISCHARGE SUMMARY  Visits from Start of Care: 10  Current functional level related to goals / functional outcomes:     PT Long Term Goals - 07/02/15 0951    PT LONG TERM GOAL #1   Title Pt will report at least 25% improvement in bed mobility.  TARGET 06/28/15   Baseline Pt reports at least 50% improvement in bed mobility.   Status Achieved   PT LONG TERM GOAL #2   Title Pt will improve TUG manual score to less than or equal to 15 seconds for decreased fall risk.   Baseline TUG score 18.02 sec; TUG cogn 20.50 sec with rollator; TUG manual NT due to pt using rollator for primary gait at this time.   Time 8   Period Weeks   Status Not Met   PT LONG TERM GOAL #3   Title Pt will improve gait velocity to at least 2.62 ft/sec with appropriate assistive device for improved gait efficiency and safety.   Time 8   Period Weeks   Status Not Met   PT LONG TERM GOAL #4   Title Pt will perform floor>stand transfer with UE support with minimal assistance for improved fall recovery.   Status Achieved          PT Short Term Goals -  06/04/15 1506    PT SHORT TERM GOAL #1   Title Pt will be independent with HEP for improved balance, transfers, posture, gait.  TARGET 05/29/15   Baseline continues to need cues for intensity 06/04/15   Time 4   Period Weeks   Status Partially Met   PT SHORT TERM GOAL #2   Title Pt will be able to perform 6 of 10 reps of sit<>stand transfers with minimal UE support, for improved safety and efficiency with transfers.   Time 4   Period Weeks   Status Achieved   PT SHORT TERM GOAL #3   Title Pt will improve TUG socre to less than or equal to 13.5 seconds for decreased fall risk.   Baseline 8.5 on 06/04/15   Time 4   Period Weeks   Status Achieved   PT SHORT TERM GOAL #4   Title Pt will verbalize/demonstrate tips to reduce freezing episodes/falls with gait and turns.   Time 4   Period Weeks   Status Achieved   PT SHORT TERM GOAL #5   Title Pt will ambulate at least 500 ft using least restrictive assistive device, with supervision for improved safety and efficiency with gait.   Time 4   Period Weeks   Status Achieved    Pt overall appears to have improved gait and functional mobility with use of rollator walker.  She appears to be utilizing strategies for turns, for pacing with gait to improve gait safety.     Remaining deficits: Decreased balance, falls (reduced from time of eval),  Decreased timing and coordination of gait.   Education / Equipment: HEP, fall prevention, techniques to reduce freezing, community PD fitness options.  Plan: Patient agrees to discharge.  Patient goals were partially met. Patient is being discharged due to being pleased with the current functional level.  ?????Pt anticipates  move to [facility] in August 2017, so no further PT eval or screens set up at this time.   [name], PT 07/02/2015 1:51 PM Phone: [phone] Fax: [phone]

## 2015-07-02 NOTE — Patient Instructions (Addendum)
In preparation for your move:  -Research neurologists who specialize in Parkinson's disease/Parkinsonism-a Movement Disorders Specialist would be ideal  -Look into Parkinson support groups and Parkinson exercise groups  -[employer] [place]-sign up for newsletter  -[employer] website  -Parkinson Wellness Recovery (PWR!)  -----------------------------------------  Optimal Fitness Program after Therapy for People with Parkinson's Disease  1)  Therapy Home Exercise Program  -Do these Exercises DAILY as instructed by your therapist  -Big, deliberate effort with exercises  -These exercises are important to perform consistently, even when therapist has  finished, because these therapy exercises often address your specific  Parkinson's difficulties   2)  Walking  -  Work up to walking 3-5 times per week, 20-30 minutes per day  -This can be done at home, driveway, quiet street or an indoor track  -Focus should be on your Best posture, arm swing, step length for your best  walking pattern  3)  Aerobic Exercise  -Work up to 3-5 times per week, 30 minutes per day  -This can be stationary bike, seated stepper machine, elliptical machine  -Work up to 7-8/10 intensity during the exercise, at minimal to moderate     Resistance   ---------------------------------------   (Exercise) Monday Tuesday Wednesday Thursday Friday Saturday Sunday   PWR! Moves standing           PWR! Moves sitting           PWR! Moves supine (on back)           PWR! Moves prone (on stomach)           PWR! Moves at counter (modified quadruped)           PWR! Hands           Ball roll or noodle roll under feet

## 2015-07-02 NOTE — Therapy (Signed)
[facility] [address] [place], [place], [address] Phone: [phone]   Fax:  [phone]  Speech Language Pathology Treatment  Patient Details  Name: Laura Lawrence MRN: [id number] Date of Birth: 1955/05/02 Referring Provider: Dr. Leta Baptist  Encounter Date: 07/02/2015      End of Session - 07/02/15 1202    Visit Number 8   Number of Visits 16   Date for SLP Re-Evaluation 07/06/15   SLP Start Time 74   SLP Stop Time  1145   SLP Time Calculation (min) 43 min   Activity Tolerance Patient tolerated treatment well      Past Medical History  Diagnosis Date  . Rheumatoid arthritis ([facility])   . Hypercholesterolemia   . Hypertension   . Multiple allergies   . Parkinson's disease ([place])     No past surgical history on file.  There were no vitals filed for this visit.      Subjective Assessment - 07/02/15 1108    Subjective "We are looking at getting relocated to [facility]."   Currently in Pain? No/denies               ADULT SLP TREATMENT - 07/02/15 1109    General Information   Behavior/Cognition Alert;Cooperative;Pleasant mood   Treatment Provided   Treatment provided Cognitive-Linquistic   Cognitive-Linquistic Treatment   Treatment focused on Aphasia;Dysarthria   Skilled Treatment Pt moving to [facility] in August, SLP provided information on [facility] to pt. Pt stated she focuses more on her articulation later in the day and has seen at least 50% improvement in her speech production at those times of day.  In 10 minutes of conversation, SLP assessed pt's use of compensations with pt's input, as pt did not overtly exhibit any word finding difficulties (but reported she had some) - compensations for anomia were used with independence. In a 15 minute conversation pt compensated for dysarthria with SBA.    Assessment / Recommendations / Plan   Plan Continue with current plan  of care   Progression Toward Goals   Progression toward goals --  see goal update - d/c today            SLP Short Term Goals - 06/25/15 1446    SLP SHORT TERM GOAL #1   Title pt will complete HEP with rare min A   Status Achieved   SLP SHORT TERM GOAL #2   Title pt will report 25% (subjective) improvement with articulation/speech production in late afternoon/early evenings   Time 1   Period Weeks   Status Partially Met   SLP SHORT TERM GOAL #3   Title pt will demo compensations for dysarthria in 10 minutes simple conversation with rare min A   Status Achieved   SLP SHORT TERM GOAL #4   Title pt will demo compensations for aphasia/verbal expression in 10 minutes simple conversation PRN   Status Partially Met   SLP SHORT TERM GOAL #5   Title pt will complete mod complex naming tasks with 85% success   Status Partially Met          SLP Long Term Goals - 07/02/15 1117    SLP LONG TERM GOAL #1   Title pt will complete HEP indpeendently   Status Not Met   SLP LONG TERM GOAL #2   Title pt to demo compensations for verbal expression in 10 minutes mod complex conversation/work-like verbal expression independently   Status Achieved  SLP LONG TERM GOAL #3   Title pt will demo compensations for dysarthria in 10 minutes mod complex convesation/work like verbal expression independently   Time --   Period --   Status Achieved   SLP LONG TERM GOAL #4   Title pt will report (subjectively) an improvement of at least 50% with articulation/speech production later in the day   Status Achieved          Plan - 07/02/15 1202    Clinical Impression Statement Pt independent in use of compensations for dysarthria and for anomia this session, in 10-15 minutes conversation. Pt appears satisfied with progress to this point and is fine with discharge. SLP agrees.   Treatment/Interventions SLP instruction and feedback;Compensatory strategies;Patient/family education;Functional tasks;Cueing  hierarchy   Potential to Achieve Goals Good   Consulted and Agree with Plan of Care Patient      Patient will benefit from skilled therapeutic intervention in order to improve the following deficits and impairments:   Dysarthria and anarthria  Aphasia   SPEECH THERAPY DISCHARGE SUMMARY  Visits from Start of Care: 8  Current functional level related to goals / functional outcomes: See goal update. Pt's LTGs were met at a greater frequency than her STGs, as pt became more independent in the last two to three sessions.  Pt had great difficulty with biting tongue during speech. At this time, pt reports she rarely has this occur. She is satisfied with progress at this time.   Remaining deficits: Anomia, which pt compensates well for at this time. Dysarthria (mild) which pt also compensates for well at this time.   Education / Equipment: Compensations for anomia and dysarthria.  Plan: Patient agrees to discharge.  Patient goals were partially met. Patient is being discharged due to the patient's request.  ?????(and being pleased with progress)       Problem List Patient Active Problem List   Diagnosis Date Noted  . Tremor 06/17/2013    [facility] ,[place], CCC-SLP  07/02/2015, 12:04 PM  [facility] [address] [place], [place], [address] Phone: [phone]   Fax:  [phone]   Name: Laura Lawrence MRN: [id number] Date of Birth: 31-Mar-1955

## 2015-07-14 ENCOUNTER — Other Ambulatory Visit: Payer: Self-pay | Admitting: *Deleted

## 2015-07-14 MED ORDER — SERTRALINE HCL 25 MG PO TABS: 25.0000 mg | ORAL_TABLET | Freq: Every day | ORAL | Status: AC

## 2015-07-16 ENCOUNTER — Other Ambulatory Visit: Payer: Self-pay | Admitting: Diagnostic Neuroimaging

## 2015-08-26 ENCOUNTER — Encounter: Payer: Self-pay | Admitting: Diagnostic Neuroimaging

## 2015-08-26 ENCOUNTER — Ambulatory Visit (INDEPENDENT_AMBULATORY_CARE_PROVIDER_SITE_OTHER): Payer: BLUE CROSS/BLUE SHIELD | Admitting: Diagnostic Neuroimaging

## 2015-08-26 VITALS — BP 122/70 | HR 69 | Ht 68.0 in | Wt 228.6 lb

## 2015-08-26 DIAGNOSIS — R2681 Unsteadiness on feet: Secondary | ICD-10-CM

## 2015-08-26 DIAGNOSIS — G20 Parkinson's disease: Secondary | ICD-10-CM | POA: Diagnosis not present

## 2015-08-26 DIAGNOSIS — R600 Localized edema: Secondary | ICD-10-CM

## 2015-08-26 MED ORDER — CARBIDOPA-LEVODOPA 25-100 MG PO TABS: 1.0000 | ORAL_TABLET | Freq: Every day | ORAL | Status: AC

## 2015-08-26 MED ORDER — ROTIGOTINE 1 MG/24HR TD PT24: 1.0000 | MEDICATED_PATCH | Freq: Every day | TRANSDERMAL | Status: AC

## 2015-08-26 NOTE — Progress Notes (Signed)
[facility]  PATIENT: Laura Lawrence DOB: 01/03/1956  REFERRING CLINICIAN:  HISTORY FROM: patient and husband  REASON FOR VISIT: follow up   HISTORICAL  CHIEF COMPLAINT:  Chief Complaint  Patient presents with  . Parkinson's disease    rm 7, husband-Laura Lawrence, "no changes but swelling better"  . Follow-up    3 month    HISTORY OF PRESENT ILLNESS:   Update 08/26/15: Since last visit, doing better on lower dose neupro patch (1mg ) and improved foot edema. Now on enbrel for rheumatoid arthritis. Also on tramadol and feeling better.   UPDATE 05/27/15: Since last visit, tried neupro 2mg  patch, and felt better tremor control and mobility. Unfortunately developed more leg edema 2 weeks after starting neupro. Now off patch x 3 weeks and edema improving.  UPDATE 02/25/15: Since last visit, tried rytary, and had more wearing off. Now back to regular carb/levo and back to baseline. She notes some dystonia in left foot. Kidney issue now felt to be a form of pyelonephritis. 4 morefalls since last visit.   UPDATE 11/18/14: Since last visit, continues on carb/levo 25/100 every 2.5 hours (5-6 tabs per day). Some wearing off after 2-3 hours. Also with new hematuria, and left renal cyst and pancreatic cyst, pending further workup. 2 more falls since last visit. Not using walker that much, but is planning to do so now. PT and OT helped back in March and April 2016.   UPDATE 05/20/14: Since last visit, doing well. Swallow testing per PCP showed primary esophageal dysphagia. No falls. Taking carb/levo 1 tab q3 hrs, 4-5 x per day, and feeling better. Headaches continue, mild tension type. No mood lability.   UPDATE 02/11/14: Since last visit patient reports increasing headaches, constipation, bladder incontinence, early morning nausea, unsteadiness and progressive hair loss. Patient tolerating carbidopa/levodopa. 2-3 months ago she stopped her sertraline, which she had been taking for 15  years for anxiety/depression and headache treatment.  UPDATE 10/10/13 (PS): Overall doing well. Had one fall since last visit, notes her feet got tied up and wouldn't move. Notes doing well with the Sinemet. Currently taking Sinemet 25-100 QID at 8am, noon, 4pm and 8pm. Denies any motor fluctuations, no wearing off noted. No dyskinesias. Happy with current control, feels it has made a big difference. Sleeping well, minimal REM behavior disorder. Notes prior lab test showing elevated LFTs. Following up with rheumatology, they may consider tapering off of the methotrexate.   UPDATE 07/31/13 (PS): At last visit she had a MRI of the lumbar spine which showed some degenerative changes but no signs of compression. Continues to have difficulty with gait, notes episodes where she will start walking, "faster and faster" and not be able to stop until she falls over. Continues to have mild rest and postural tremor. Notes worsening handwriting, described as messy and smaller.   07/16/13 (PS):  At last visit she had a MRI of the brain and C spine both of which were overall unremarkable. Returns today with continued difficulty with her walking, feels very off balance, unsteady on her feet. Notes continued rest and postural/action tremor. No other acute issues at this time.   PRIOR HPI (06/17/13, Dr. 09/15/13): Laura Lawrence is a 60 y.o. female here as a referral from Dr. Warner Lawrence for tremors. Feels symptoms started around 2 months ago and has gotten progressively worse since then. Describes both a rest and action/postural tremor. Handwriting has gotten messier, no change in size. Notes some generalized slowing down of her  movements. She feels off balance, having falls recently. Having difficulty with fine motor tasks. Notes some muscle cramps, aching in her legs. Tremor worse with stress/anxiety. No change with caffeine. Does not drink EtOH. No recent changes to her medications. Around Thanksgiving has noted loss of  bladder control, states she will go without even knowing she was going. No family history of tremor. Has history of HTN, no known TIA or strokes in the past.    REVIEW OF SYSTEMS: Full 14 system review of systems performed and negative except for: leg swelling.     ALLERGIES: Allergies  Allergen Reactions  . Codeine Hives  . Guaifenesin & Derivatives Hives  . Nsaids     ulcers  . Other Diarrhea    Almonds  Almonds   . Sulfa Antibiotics Hives  . Penicillins Rash    HOME MEDICATIONS: Outpatient Prescriptions Prior to Visit  Medication Sig Dispense Refill  . acetaminophen (TYLENOL) 500 MG tablet Take 500 mg by mouth every 6 (six) hours as needed.    . ALPHA LIPOIC ACID PO Take 600 mg by mouth. 11/18/14 takes 3 tabs daily    . carbidopa-levodopa (SINEMET IR) 25-100 MG tablet Take 1 tablet by mouth 5 (five) times daily. 150 tablet 12  . Cholecalciferol (VITAMIN D-3) 1000 UNITS CAPS Take 1,000 Units by mouth.    . clobetasol cream (TEMOVATE) 0.05 % Apply 1 application topically daily.     . Multiple Vitamins-Minerals (MULTIVITAMIN WITH MINERALS) tablet Take 1 tablet by mouth daily.    Marland Kitchen nystatin (MYCOSTATIN) powder Apply topically 4 (four) times daily.    Marland Kitchen oxybutynin (DITROPAN-XL) 10 MG 24 hr tablet   10  . pantoprazole (PROTONIX) 40 MG tablet Take 40 mg by mouth 2 (two) times daily.  5  . polyethylene glycol (MIRALAX / GLYCOLAX) packet Take 8.5 g by mouth daily.     . promethazine (PHENERGAN) 25 MG tablet Take 1 tablet (25 mg total) by mouth every 6 (six) hours as needed for nausea or vomiting. 30 tablet 0  . Rotigotine 1 MG/24HR PT24 Place 1 patch (1 mg total) onto the skin daily. 30 patch 12  . sertraline (ZOLOFT) 25 MG tablet Take 1 tablet (25 mg total) by mouth daily. 90 tablet 1  . traMADol (ULTRAM) 50 MG tablet     . Adalimumab (HUMIRA) 20 MG/0.4ML PSKT Inject 40 mg into the skin.    Marland Kitchen HUMIRA PEN 40 MG/0.8ML PNKT Inject 0.8 mLs into the muscle every 14 (fourteen) days.  Thursdays  2   No facility-administered medications prior to visit.    PAST MEDICAL HISTORY: Past Medical History  Diagnosis Date  . Rheumatoid arthritis ([facility])   . Hypercholesterolemia   . Hypertension   . Multiple allergies   . Parkinson's disease ([facility])   . Endometriosis     history    PAST SURGICAL HISTORY: Past Surgical History  Procedure Laterality Date  . Tonsillectomy  1973  . Laparoscopy  1981  . Finger surgery Right 1991    tendon reattached  . Arthroscopic repair acl Left 2004    replaced with cadaver  . Uterine ablation      fibroids  . Lap choley  1993    FAMILY HISTORY: Family History  Problem Relation Age of Onset  . Heart disease Father   . Osteoporosis Father   . Hypertension Father   . Leukemia Father   . Diabetes Mother   . Arthritis Mother   . Asthma Mother   .  Hypertension Mother   . Bursitis Mother   . Gout    . Brain cancer Paternal Aunt   . Muscular dystrophy Maternal Uncle     SOCIAL HISTORY:  Social History   Social History  . Marital Status: Married    Spouse Name: Casimiro Needle  . Number of Children: 1  . Years of Education: college   Occupational History  . self employed    Social History Main Topics  . Smoking status: Never Smoker   . Smokeless tobacco: Never Used  . Alcohol Use: No  . Drug Use: No  . Sexual Activity: Not on file   Other Topics Concern  . Not on file   Social History Narrative   Patient is married Casimiro Needle), 2 children   Associates degree   Right handed   0-1 cups of coffee a day     PHYSICAL EXAM  Filed Vitals:   08/26/15 1342  BP: 122/70  Pulse: 69  Height: 5\' 8"  (1.727 m)  Weight: 228 lb 9.6 oz (103.692 kg)    Body mass index is 34.77 kg/(m^2).  No exam data present  No flowsheet data found.  GENERAL EXAM: Patient is in no distress; well developed, nourished and groomed; neck is supple; MILD EDEMA IN FEET  CARDIOVASCULAR: Regular rate and rhythm, no murmurs, no carotid  bruits  NEUROLOGIC: MENTAL STATUS: awake, alert, language fluent, comprehension intact, naming intact, fund of knowledge appropriate; MASKED FACIES CRANIAL NERVE:  pupils equal and reactive to light, visual fields full to confrontation, extraocular muscles intact, no nystagmus, facial sensation and strength symmetric, hearing intact, palate elevates symmetrically, uvula midline, shoulder shrug symmetric, tongue midline; MASKED FACIES MOTOR: normal bulk; MOD RIGIDITY IN LUE > RUE; NO RESTING TREMOR; MOD BRADYKINESIA IN BUE AND BLE (L SLOWER THAN R); full strength in the BUE, BLE SENSORY: normal and symmetric to light touch  COORDINATION: finger-nose-finger, fine finger movements normal REFLEXES: deep tendon reflexes present and symmetric GAIT/STATION: narrow based gait; DECR ARM SWING; USES ROLLATOR    DIAGNOSTIC DATA (LABS, IMAGING, TESTING) - I reviewed patient records, labs, notes, testing and imaging myself where available.  Lab Results  Component Value Date   WBC 8.9 12/12/2014   HGB 11.8* 12/12/2014   HCT 35.4* 12/12/2014   MCV 87.2 12/12/2014   PLT 266 12/12/2014      Component Value Date/Time   NA 140 12/12/2014 1834   K 3.8 12/12/2014 1834   CL 104 12/12/2014 1834   CO2 26 12/12/2014 1834   GLUCOSE 104* 12/12/2014 1834   BUN 11 12/12/2014 1834   CREATININE 0.72 12/12/2014 1834   CALCIUM 9.5 12/12/2014 1834   PROT 6.1* 12/12/2014 1834   ALBUMIN 3.1* 12/12/2014 1834   AST 102* 12/12/2014 1834   ALT 140* 12/12/2014 1834   ALKPHOS 82 12/12/2014 1834   BILITOT 0.2* 12/12/2014 1834   GFRNONAA >60 12/12/2014 1834   GFRAA >60 12/12/2014 1834   No results found for: CHOL, HDL, LDLCALC, LDLDIRECT, TRIG, CHOLHDL No results found for: 12/14/2014 Lab Results  Component Value Date   VITAMINB12 >1999* 07/16/2013   Lab Results  Component Value Date   TSH 2.320 07/16/2013     07/09/13 MRI brain (without) demonstrating: 1. Few (~4) punctate foci of periventricular and  subcortical non-specific gliosis. 2. No acute findings.  07/09/13 MRI cervical spine (without) demonstrating mild disc bulging at C3-4 and C4-5. No spinal stenosis or foraminal narrowing. No intrinsic or compressive spinal cord lesions.  07/23/13 MRI lumbar spine (without)  demonstrating: 1. At L4-5: disc bulging and facet and ligamentum flavum hypertrophy with moderate spinal stenosis and mild biforaminal stenosis  2. At L5-S1: disc bulging and facet arthropathy, with mild spinal stenosis, left lateral recess stenosis, mild right and moderate left foraminal stenosis  3. At L3-4: disc bulging, leftward disc protrusion, facet and ligamentum flavum hypertrophy with mild spinal stenosis and mild biforaminal stenosis      ASSESSMENT AND PLAN  60 y.o. year old female here with parkinson's disease, lumbar spinal stenosis, depression and rheumatoid arthritis. Some progression of symptoms and more wearing off.    MEDS TRIED/FAILED  On carbidopa/levodopa with benefit.  Tried rytary, but not that effective.   Neupro /24hr patch helped but caused peripheral edema --> now tolerating lower dose patch.    Dx:  Parkinson's disease ([facility])  Gait instability  Bilateral leg edema    PLAN: - continue carb/levo 25/100 (1 tab 5x per day) - continue neupro patch ( /24hr) - use walker - continue PT/OT/ST evaluations and treatements - manage peripheral edema (less salt, more physical activity, raise feet up when laying down, use compression stockings)  Meds ordered this encounter  Medications  . carbidopa-levodopa (SINEMET IR) 25-100 MG tablet    Sig: Take 1 tablet by mouth 5 (five) times daily.    Dispense:  150 tablet    Refill:  12  . Rotigotine 1 MG/24HR PT24    Sig: Place 1 patch (1 mg total) onto the skin daily.    Dispense:  30 patch    Refill:  12   Return in about 6 months (around 02/26/2016).    Suanne Marker, MD 08/26/2015, 1:49 PM Certified in Neurology,  Neurophysiology and Neuroimaging  [facility] [address] [place], [place] [address] [phone]

## 2015-08-26 NOTE — Patient Instructions (Signed)

## 2016-02-03 ENCOUNTER — Ambulatory Visit: Payer: BLUE CROSS/BLUE SHIELD | Admitting: Diagnostic Neuroimaging

## 2016-02-10 ENCOUNTER — Ambulatory Visit: Payer: BLUE CROSS/BLUE SHIELD | Admitting: Diagnostic Neuroimaging

## 2016-02-20 ENCOUNTER — Other Ambulatory Visit: Payer: Self-pay | Admitting: Diagnostic Neuroimaging

## 2016-02-29 ENCOUNTER — Telehealth: Payer: Self-pay | Admitting: *Deleted

## 2016-02-29 DIAGNOSIS — G20 Parkinson's disease: Secondary | ICD-10-CM

## 2016-03-01 NOTE — Telephone Encounter (Signed)
Ok to setup referral. -VRP

## 2016-03-01 NOTE — Telephone Encounter (Signed)
Referral placed.

## 2016-03-03 ENCOUNTER — Other Ambulatory Visit: Payer: Self-pay | Admitting: Diagnostic Neuroimaging

## 2016-03-11 ENCOUNTER — Telehealth: Payer: Self-pay | Admitting: *Deleted

## 2016-03-11 NOTE — Telephone Encounter (Signed)
This was done on 03/01/16 by Darreld Mclean. Spoke with patient and informed her of referral sent. Advised she call their office to follow up. She verbalized understanding, appreciation.

## 2016-03-16 ENCOUNTER — Telehealth: Payer: Self-pay | Admitting: Diagnostic Neuroimaging

## 2016-03-16 NOTE — Telephone Encounter (Signed)
Called and left patient a message asking  her to call me back regarding referral  With Dr. Clifton James.

## 2016-03-16 NOTE — Telephone Encounter (Signed)
I have spoke to Mrs. Laura Lawrence she has apt with Dr. Clifton James Feb. 6 ,2018  . Patient understands all details.  Thanks Annabelle Harman.

## 2016-09-11 ENCOUNTER — Other Ambulatory Visit: Payer: Self-pay | Admitting: Diagnostic Neuroimaging

## 2016-12-12 IMAGING — MR MR ABDOMEN WO/W CM
11 of 18 series · 27 of 48 positions shown · IV contrast (multihance)
Comparison: 10/29/2014

CLINICAL DATA: Pancreatic cystic lesion. Left renal mass.
Gastroduodenal mass.

EXAM:
MRI ABDOMEN WITHOUT AND WITH CONTRAST (INCLUDING MRCP)
TECHNIQUE: Multiplanar multisequence MR imaging of the abdomen was performed
both before and after the administration of intravenous contrast.
Heavily T2-weighted images of the biliary and pancreatic ducts were
obtained, and three-dimensional MRCP images were rendered by post
processing.
CONTRAST:  19mL MULTIHANCE GADOBENATE DIMEGLUMINE 529 MG/ML IV SOLN
Creatinine was obtained on site at [HOSPITAL] at [HOSPITAL].
Results: Creatinine 0.7 mg/dL.

[Series 5: T2 · coronal · 5.0mm · 1.56mm/px · 2 of 35 slices shown (1 of 3)]
[im 1/35]
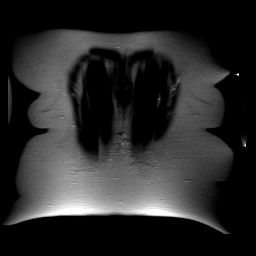
[im 35/35]
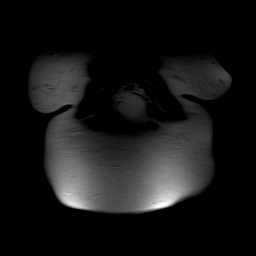

[Series 6: axial tru fisp · axial · 4.0mm · 1.48mm/px · z∈[-87,+111]mm · 3 of 39 slices shown]
[im 1/39]
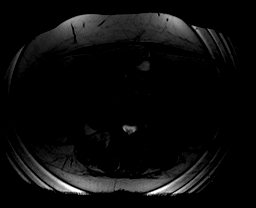
[im 20/39]
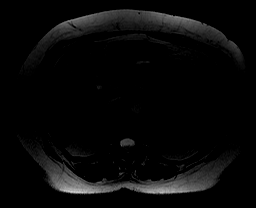
[im 39/39]
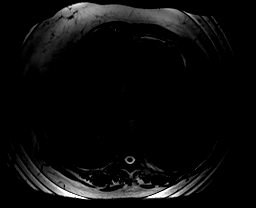

[Series 7: T2 · axial · 6.5mm · 0.74mm/px · z∈[-85,+141]mm · 2 of 30 slices shown (2 of 3)]
[im 1/30]
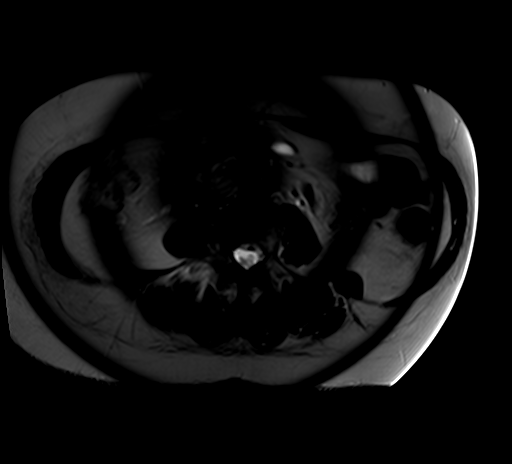
[im 30/30]
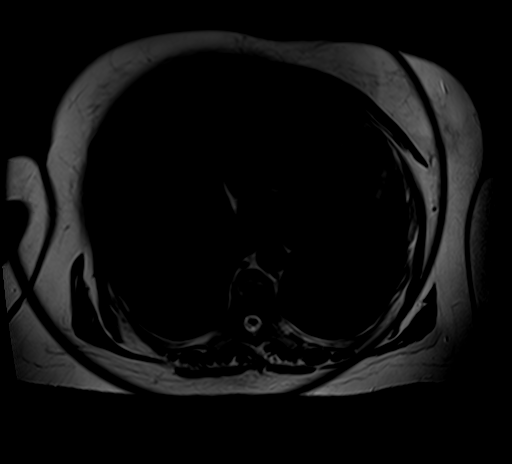

[Series 8: axial in out · axial · 6.0mm · 0.74mm/px · z∈[-87,+99]mm · 3 of 56 slices shown]
[im 1/56]
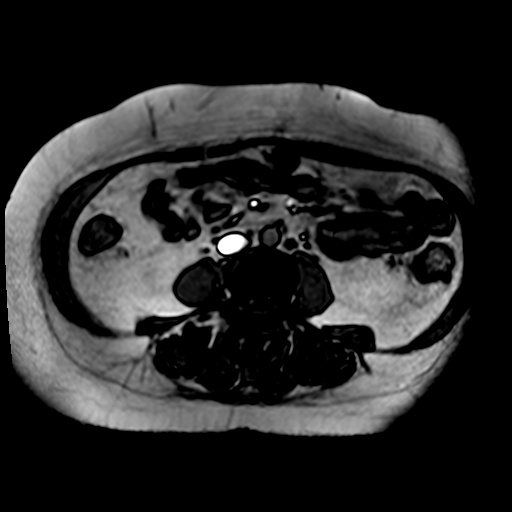
[im 28/56]
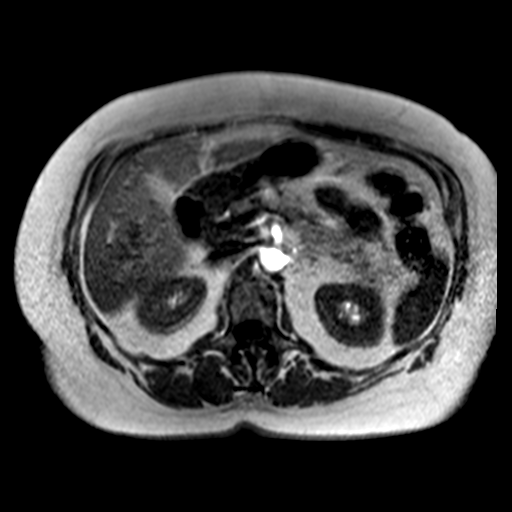
[im 56/56]
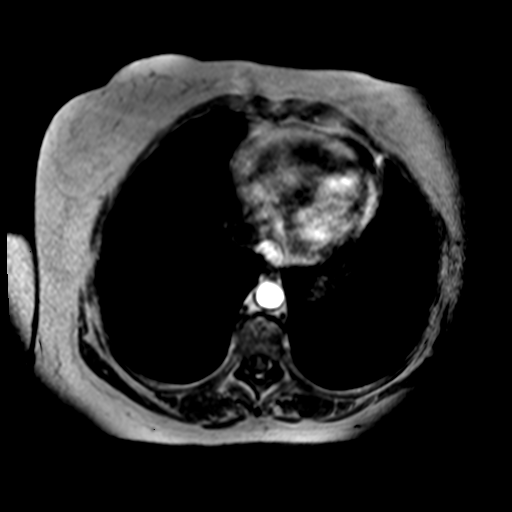

[Series 11: ep2d_diff_b50_500_800_p2 · axial · 6.5mm · 1.98mm/px · z∈[-85,+141]mm · 5 of 90 slices shown]
[im 1/90]
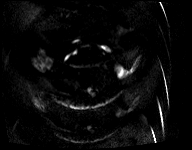
[im 23/90]
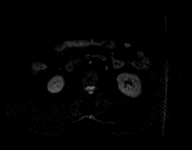
[im 45/90]
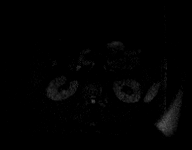
[im 67/90]
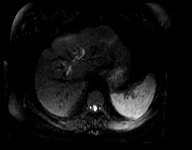
[im 90/90]
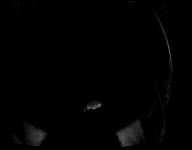

[Series 12: ep2d_[id number]_500_800_p2_adc · axial · 6.5mm · 1.98mm/px · 1 of 30 slices shown]
[im 1/30]
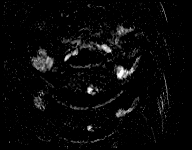

[Series 13: T2 · axial · 5.0mm · 1.37mm/px · 1 of 32 slices shown (3 of 3)]
[im 1/32]
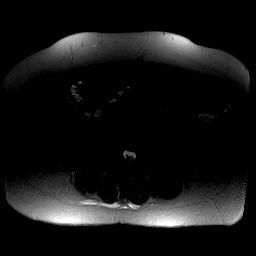

[Series 16: T1 dynamic · axial · non-contrast · 2.5mm · 0.78mm/px · z∈[-83,+115]mm · 3 of 80 slices shown]
[im 1/80]
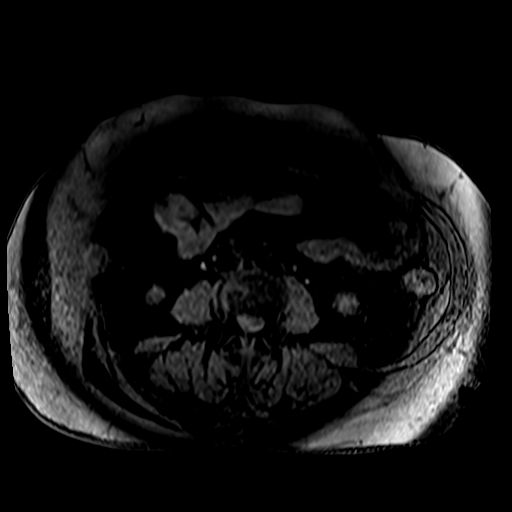
[im 40/80]
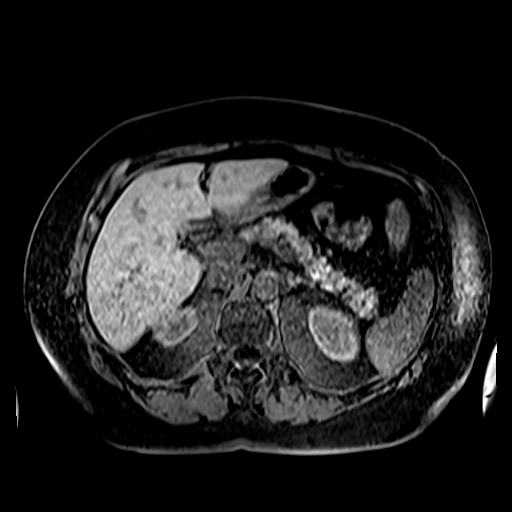
[im 80/80]
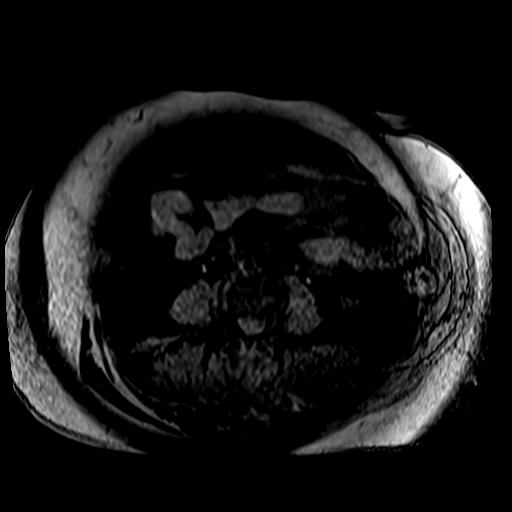

[Series 17: post 25 sec · axial · 2.5mm · 0.78mm/px · z∈[-83,+115]mm · 3 of 80 slices shown]
[im 1/80]
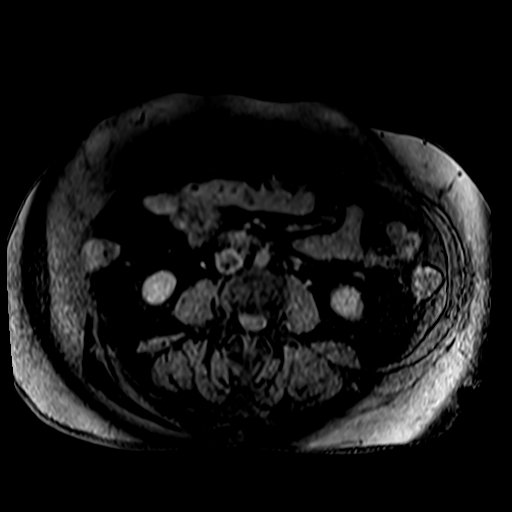
[im 40/80]
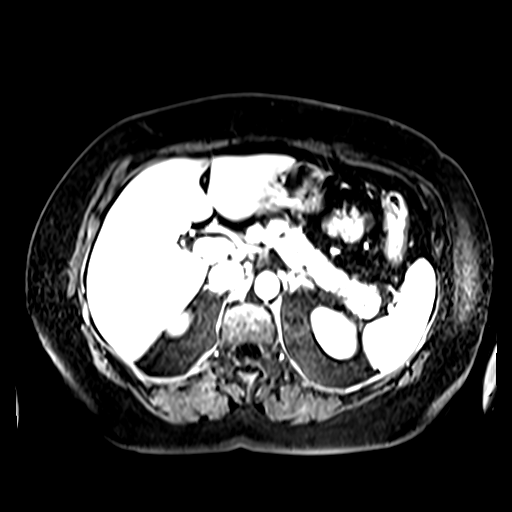
[im 80/80]
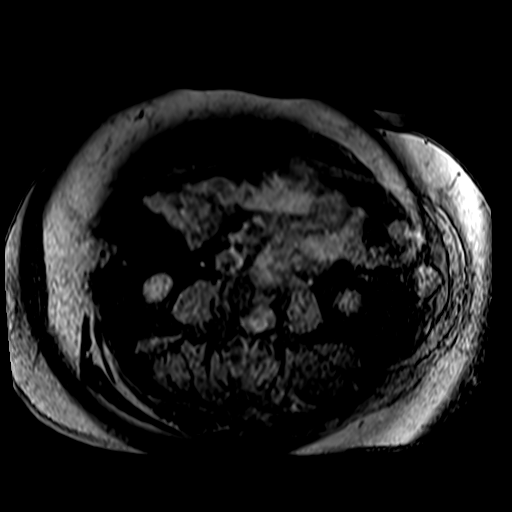

[Series 18: post 25 sec_sub · axial · 2.5mm · 0.78mm/px · z∈[-83,+115]mm · 3 of 80 slices shown]
[im 1/80]
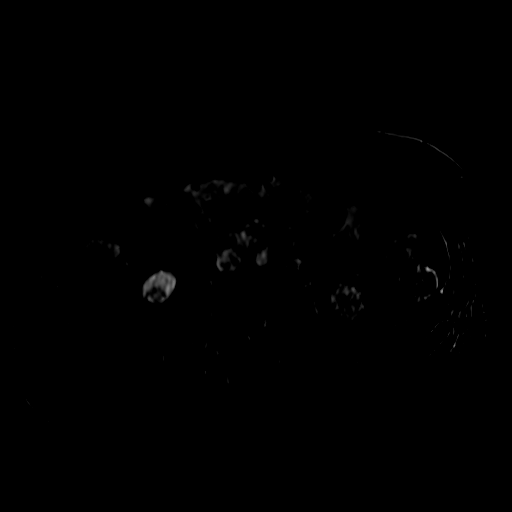
[im 40/80]
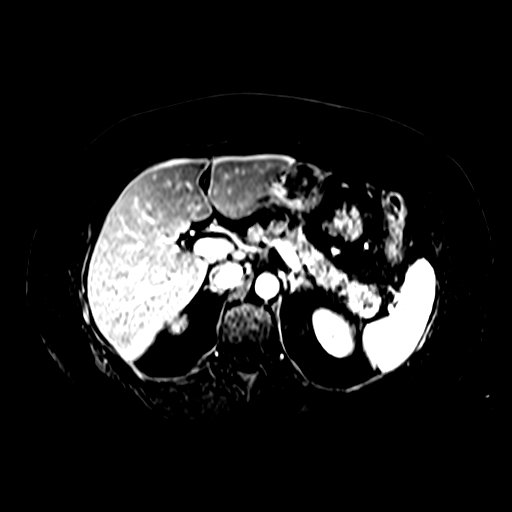
[im 80/80]
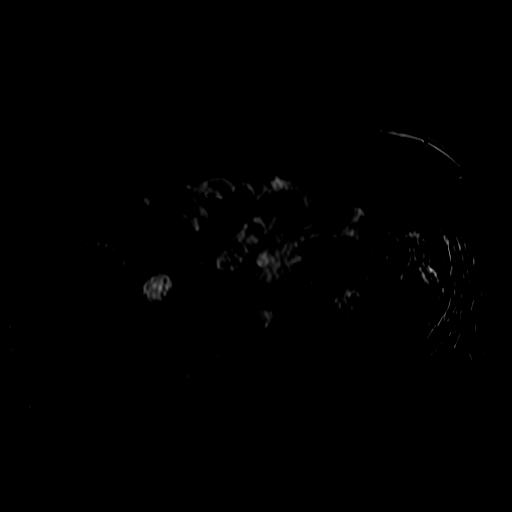

[Series 19: post 45 sec · axial · 2.5mm · 0.78mm/px · 1 of 80 slices shown]
[im 1/80]
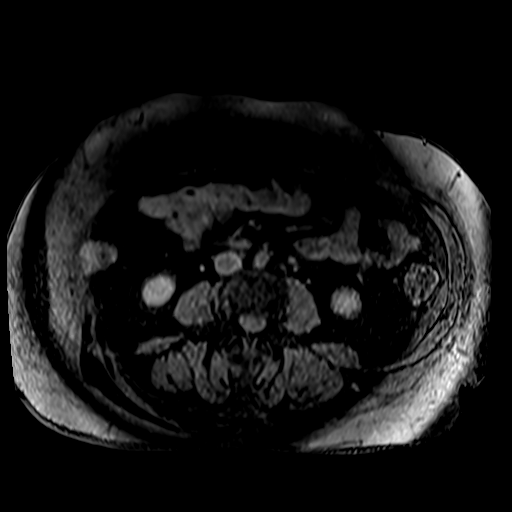

[27 of 48 positions shown; findings below may reference images not displayed]

FINDINGS: Lower chest:  Unremarkable

Hepatobiliary: Cholecystectomy. Motion artifact mildly obscures the
MRCP images. CBD measures up to 8 mm, likely representing
physiologic dilatation.

Pancreas: Tiny T2 signal hyperintensities in the pancreatic tail,
body, and head suggesting small dilated side ducts likely from
remote pancreatitis. The largest of these measures up to about 5 mm.
The area on the pancreatic head seen previously appears to represent
a cluster of several of the small dilated side ducts.

Spleen: Unremarkable

Adrenals/Urinary Tract: The area of concern in the left kidney upper
pole does not demonstrate precontrast abnormal signal
characteristics. On early postcontrast phase images there appears to
be a bar of cortex in this vicinity extending along the parenchyma.
On delayed post-contrast images such as image 49 of series 24 and
image 23 of series 23, there is some very faint hypo enhancement in
the left anterolateral mid upper kidney but the appearance is highly
subtle. There is also some faint peripheral hypo enhancement in the
lower pole of the left kidney peripherally on image 14 of series 23.
The appearance is much less rounded and masslike than on the recent
CT. The small left renal calculus shown on CT is not readily
visible.

Stomach/Bowel: The questionable mucosal lesion along the gastric
antrum posteriorly is not well seen today.

Vascular/Lymphatic: Mildly prominent porta hepatis/ peripancreatic
lymph node near the bifurcation of the celiac trunk, measuring
cm in short axis on image 40 of series 18, previously 1.1 cm.
Abdominal aortic atherosclerosis without significant luminal
narrowing.

Other: No supplemental non-categorized findings.

Musculoskeletal: Lumbar spondylosis and degenerative disc disease
notably at L3-4, L4-5, and L5-S1. Mild levoconvex lumbar scoliosis.
IMPRESSION: 1. Unusual evolution of findings, with only very faint/subtle
delayed phase signal hypointensity in the area of previously
well-defined rounded masslike appearance in the left anterolateral
mid upper kidney. This significant improvement raises the
possibility that this may have represented pyelonephritis or some
form of inflammatory process rather than neoplastic
disease/malignancy. Correlate with the urine culture an urinalysis
performed around the time of the prior CT. If there was no
infection, then despite the subtlety of findings on today's MRI this
probably warrants renal protocol CT or MRI follow up in 6 months
time in order to ensure complete resolution or only chronic non
progressive findings.
2. Nonvisualization of the prior potential mucosal lesion along the
stomach antrum. Has the patient had interval endoscopy?
3. Multiple tiny dilated side ducts along the pancreas are likely
residua from prior pancreatitis or chronic pancreatitis. The area of
concern on CT along the pancreatic head appears to simply represent
a confluence of several of these dilated side ducts rather than a
single large cystic lesion.
4. There is a slightly prominent peripancreatic/porta hepatis lymph
node, essentially stable from 10/29/2014, technically nonspecific
for reactive lymph node versus neoplastic process.

## 2018-05-16 DEATH — deceased
